# Patient Record
Sex: Female | Born: 1980 | Race: White | Hispanic: No | Marital: Single | Smoking: Former smoker
Health system: Southern US, Community
[De-identification: ages and names within clinical notes are randomized; demographics above are authoritative.]

## PROBLEM LIST (undated history)

## (undated) DIAGNOSIS — F988 Other specified behavioral and emotional disorders with onset usually occurring in childhood and adolescence: Secondary | ICD-10-CM

## (undated) HISTORY — PX: NO PAST SURGERIES: SHX2092

---
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
## 2001-08-01 ENCOUNTER — Other Ambulatory Visit: Admission: RE | Admit: 2001-08-01 | Discharge: 2001-08-01 | Payer: Self-pay | Admitting: *Deleted

## 2006-07-14 DIAGNOSIS — E669 Obesity, unspecified: Secondary | ICD-10-CM | POA: Insufficient documentation

## 2006-07-27 ENCOUNTER — Ambulatory Visit: Payer: Self-pay

## 2006-12-06 DIAGNOSIS — F988 Other specified behavioral and emotional disorders with onset usually occurring in childhood and adolescence: Secondary | ICD-10-CM

## 2006-12-06 HISTORY — DX: Other specified behavioral and emotional disorders with onset usually occurring in childhood and adolescence: F98.8

## 2006-12-30 DIAGNOSIS — S335XXA Sprain of ligaments of lumbar spine, initial encounter: Secondary | ICD-10-CM | POA: Insufficient documentation

## 2006-12-30 DIAGNOSIS — Z720 Tobacco use: Secondary | ICD-10-CM | POA: Insufficient documentation

## 2008-06-14 DIAGNOSIS — M722 Plantar fascial fibromatosis: Secondary | ICD-10-CM | POA: Insufficient documentation

## 2008-06-14 DIAGNOSIS — M549 Dorsalgia, unspecified: Secondary | ICD-10-CM | POA: Insufficient documentation

## 2008-06-14 HISTORY — DX: Plantar fascial fibromatosis: M72.2

## 2009-02-20 DIAGNOSIS — R5381 Other malaise: Secondary | ICD-10-CM | POA: Insufficient documentation

## 2015-04-12 ENCOUNTER — Ambulatory Visit: Payer: Medicaid Other

## 2015-04-12 ENCOUNTER — Ambulatory Visit
Admission: EM | Admit: 2015-04-12 | Discharge: 2015-04-12 | Disposition: A | Discharge status: Home / Self Care | Payer: Medicaid Other | Attending: Internal Medicine | Admitting: Internal Medicine

## 2015-04-12 DIAGNOSIS — Y999 Unspecified external cause status: Secondary | ICD-10-CM | POA: Insufficient documentation

## 2015-04-12 DIAGNOSIS — M25572 Pain in left ankle and joints of left foot: Secondary | ICD-10-CM | POA: Diagnosis present

## 2015-04-12 DIAGNOSIS — S92302A Fracture of unspecified metatarsal bone(s), left foot, initial encounter for closed fracture: Secondary | ICD-10-CM | POA: Diagnosis not present

## 2015-04-12 DIAGNOSIS — Y9389 Activity, other specified: Secondary | ICD-10-CM | POA: Diagnosis not present

## 2015-04-12 DIAGNOSIS — X58XXXA Exposure to other specified factors, initial encounter: Secondary | ICD-10-CM | POA: Diagnosis not present

## 2015-04-12 DIAGNOSIS — S92352A Displaced fracture of fifth metatarsal bone, left foot, initial encounter for closed fracture: Secondary | ICD-10-CM | POA: Diagnosis not present

## 2015-04-12 DIAGNOSIS — Y9289 Other specified places as the place of occurrence of the external cause: Secondary | ICD-10-CM | POA: Diagnosis not present

## 2015-04-12 DIAGNOSIS — M79672 Pain in left foot: Secondary | ICD-10-CM | POA: Diagnosis present

## 2015-04-12 HISTORY — DX: Other specified behavioral and emotional disorders with onset usually occurring in childhood and adolescence: F98.8

## 2015-04-12 MED ORDER — OXYCODONE-ACETAMINOPHEN 5-325 MG PO TABS: 2.0000 | ORAL_TABLET | ORAL | Status: DC | PRN

## 2015-04-12 MED ORDER — KETOROLAC TROMETHAMINE 60 MG/2ML IM SOLN
60.0000 mg | Freq: Once | INTRAMUSCULAR | Status: AC
  Administered 2015-04-12: 60 mg via INTRAMUSCULAR

## 2015-04-12 MED ORDER — IBUPROFEN 800 MG PO TABS: 800.0000 mg | ORAL_TABLET | Freq: Three times a day (TID) | ORAL | Status: DC

## 2015-04-12 NOTE — ED Provider Notes (Addendum)
CSN: [id number]     Arrival date & time 04/12/15  1450 History   First MD Initiated Contact with Patient 04/12/15 1601     Chief Complaint  Patient presents with  . Foot Pain  . Ankle Pain   HPI Twisted left foot/ankle and felt and heard ankle "pop". + swelling. Very painful to bear weight Was coming down some steps and missed the bottom step, inverting her left ankle she was able to walk subsequently, but does have some swelling of the L lateral ankle and is having a lot of pain at the lateral aspect of the foot, just distal to the mid foot. Also having some pain and a small bruise just inferior to the right knee. Did not hit her head, no loss of consciousness.  Past Medical History  Diagnosis Date  . ADD (attention deficit disorder)    History reviewed. No pertinent past surgical history. Family History  Problem Relation Age of Onset  . Cancer Mother   . Diabetes Father   . Coronary artery disease Father    History  Substance Use Topics  . Smoking status: Former Games developermoker  . Smokeless tobacco: Not on file  . Alcohol Use: Yes     Comment: socially   OB History    No data available     Review of Systems  All other systems reviewed and are negative.   Allergies  Penicillins  Home Medications   Prior to Admission medications   Medication Sig Start Date End Date Taking? Authorizing Provider  amphetamine-dextroamphetamine (ADDERALL XR) 15 MG 24 hr capsule Take 15 mg by mouth 2 (two) times daily.   Yes Historical Provider, MD  norethindrone-ethinyl estradiol (CYCLAFEM,ALYACEN) 0.5/0.75/1-35 MG-MCG tablet Take 1 tablet by mouth daily.   Yes Historical Provider, MD   BP 136/78 mmHg  Pulse 86  Temp(Src) 98.1 F (36.7 C) (Oral)  Resp 16  Ht 5\' 7"  (1.702 m)  Wt 190 lb (86.183 kg)  BMI 29.75 kg/m2  SpO2 99% Physical Exam  Constitutional: She is oriented to person, place, and time. No distress.  Alert, nicely groomed  HENT:  Head: Atraumatic.  Eyes:  Conjugate gaze, no  eye redness/drainage  Neck: Neck supple.  Cardiovascular: Normal rate.   Pulmonary/Chest: No respiratory distress.  Lungs clear, symmetric breath sounds  Abdominal: She exhibits no distension.  Musculoskeletal:  Left ankle with moderate swelling of the lateral aspect, no bruising, mild tenderness particularly anterior to the malleolus. More pronounced swelling, somewhat tense, lateral border of the foot from the mid foot to the fifth MTP. Quite tender there. Foot is warm, with mild restriction of ankle flexion and extension due to pain. Skin is intact. 1.5 inch bruise overlying the distal patellar tendon on the right, preserved range of motion of the right knee. No knee effusion  Neurological: She is alert and oriented to person, place, and time.  Skin: Skin is warm and dry.  No cyanosis  Nursing note and vitals reviewed.   ED Course  Procedures  Labs Review Labs Reviewed - No data to display  Imaging Review Dg Ankle Complete Left  04/12/2015   CLINICAL DATA:  Twisted ankle. Ankle and foot pain. Initial encounter.  EXAM: LEFT FOOT - COMPLETE 3+ VIEW; LEFT ANKLE COMPLETE - 3+ VIEW  COMPARISON:  None.  FINDINGS: The ankle mortise is congruent. The talar dome is intact. No ankle fracture.  The alignment of the foot is anatomic. There is an oblique minimally displaced fracture through the distal fifth  metatarsal diaphysis. No angulation. No intra-articular extension. Bony excrescence is present off the lateral aspect of the third metatarsal neck. This may be old trauma or benign osteochondroma.  IMPRESSION: Minimally displaced oblique fifth metatarsal shaft fracture.   Electronically Signed   By: Andreas NewportGeoffrey  Lamke M.D.   On: 04/12/2015 17:16   Dg Foot Complete Left  04/12/2015   CLINICAL DATA:  Twisted ankle. Ankle and foot pain. Initial encounter.  EXAM: LEFT FOOT - COMPLETE 3+ VIEW; LEFT ANKLE COMPLETE - 3+ VIEW  COMPARISON:  None.  FINDINGS: The ankle mortise is congruent. The talar dome is  intact. No ankle fracture.  The alignment of the foot is anatomic. There is an oblique minimally displaced fracture through the distal fifth metatarsal diaphysis. No angulation. No intra-articular extension. Bony excrescence is present off the lateral aspect of the third metatarsal neck. This may be old trauma or benign osteochondroma.  IMPRESSION: Minimally displaced oblique fifth metatarsal shaft fracture.   Electronically Signed   By: Andreas NewportGeoffrey  Lamke M.D.   On: 04/12/2015 17:16     MDM   1. Fracture of 5th metatarsal, left, closed, initial encounter    Injection of toradol 60mg  IM given at urgent care for pain. Patient was placed in a boot orthosis by nurse, and given crutches.  She is to be nonweightbearing until follow-up with Ortho in one week.  Rx for ibuprofen 800 mg, and #15 Percocet given for pain.  Note for work given, to cover the time period until she follows up with orthopedics.  Eustace MooreLaura W Malak Orantes, MD 04/12/15 1902  Eustace MooreLaura W Leilany Digeronimo, MD 04/18/15 [phone]

## 2015-04-12 NOTE — Discharge Instructions (Signed)
Wear boot and use crutches to keep weight off of foot until followup with orthopedist next week.  Ice and elevate to decrease swelling/pain.    Metatarsal Fracture, Undisplaced A metatarsal fracture is a break in the bone(s) of the foot. These are the bones of the foot that connect your toes to the bones of the ankle. DIAGNOSIS  The diagnoses of these fractures are usually made with X-rays. If there are problems in the forefoot and x-rays are normal a later bone scan will usually make the diagnosis.  TREATMENT AND HOME CARE INSTRUCTIONS  Treatment may or may not include a cast or walking shoe. When casts are needed the use is usually for short periods of time so as not to slow down healing with muscle wasting (atrophy).  Activities should be stopped until further advised by your caregiver.  Wear shoes with adequate shock absorbing capabilities and stiff soles.  Alternative exercise may be undertaken while waiting for healing. These may include bicycling and swimming, or as your caregiver suggests.  It is important to keep all follow-up visits or specialty referrals. The failure to keep these appointments could result in improper bone healing and chronic pain or disability.  Warning: Do not drive a car or operate a motor vehicle until your caregiver specifically tells you it is safe to do so. IF YOU DO NOT HAVE A CAST OR SPLINT:  You may walk on your injured foot as tolerated or advised.  Do not put any weight on your injured foot for as long as directed by your caregiver. Slowly increase the amount of time you walk on the foot as the pain allows or as advised.  Use crutches until you can bear weight without pain. A gradual increase in weight bearing may help.  Apply ice to the injury for 15-20 minutes each hour while awake for the first 2 days. Put the ice in a plastic bag and place a towel between the bag of ice and your skin.  Only take over-the-counter or prescription medicines for  pain, discomfort, or fever as directed by your caregiver. SEEK IMMEDIATE MEDICAL CARE IF:   Your cast gets damaged or breaks.  You have continued severe pain or more swelling than you did before the cast was put on, or the pain is not controlled with medications.  Your skin or nails below the injury turn blue or grey, or feel cold or numb.  There is a bad smell, or new stains or pus-like (purulent) drainage coming from the cast. MAKE SURE YOU:   Understand these instructions.  Will watch your condition.  Will get help right away if you are not doing well or get worse. Document Released: 08/14/2002 Document Revised: 02/14/2012 Document Reviewed: 07/05/2008 [facility] Patient Information 2015 [place], [place]. This information is not intended to replace advice given to you by your health care provider. Make sure you discuss any questions you have with your health care provider.

## 2015-04-12 NOTE — ED Notes (Signed)
Twisted left foot/ankle and felt and heard ankle "pop". + swelling. Very painful to bear weight

## 2015-09-23 IMAGING — CR DG ANKLE COMPLETE 3+V*L*
3 series · 3 of 3 positions shown · non-contrast
Comparison: None.

CLINICAL DATA: Twisted ankle. Ankle and foot pain. Initial
encounter.

EXAM:
LEFT FOOT - COMPLETE 3+ VIEW; LEFT ANKLE COMPLETE - 3+ VIEW

[foot ap]
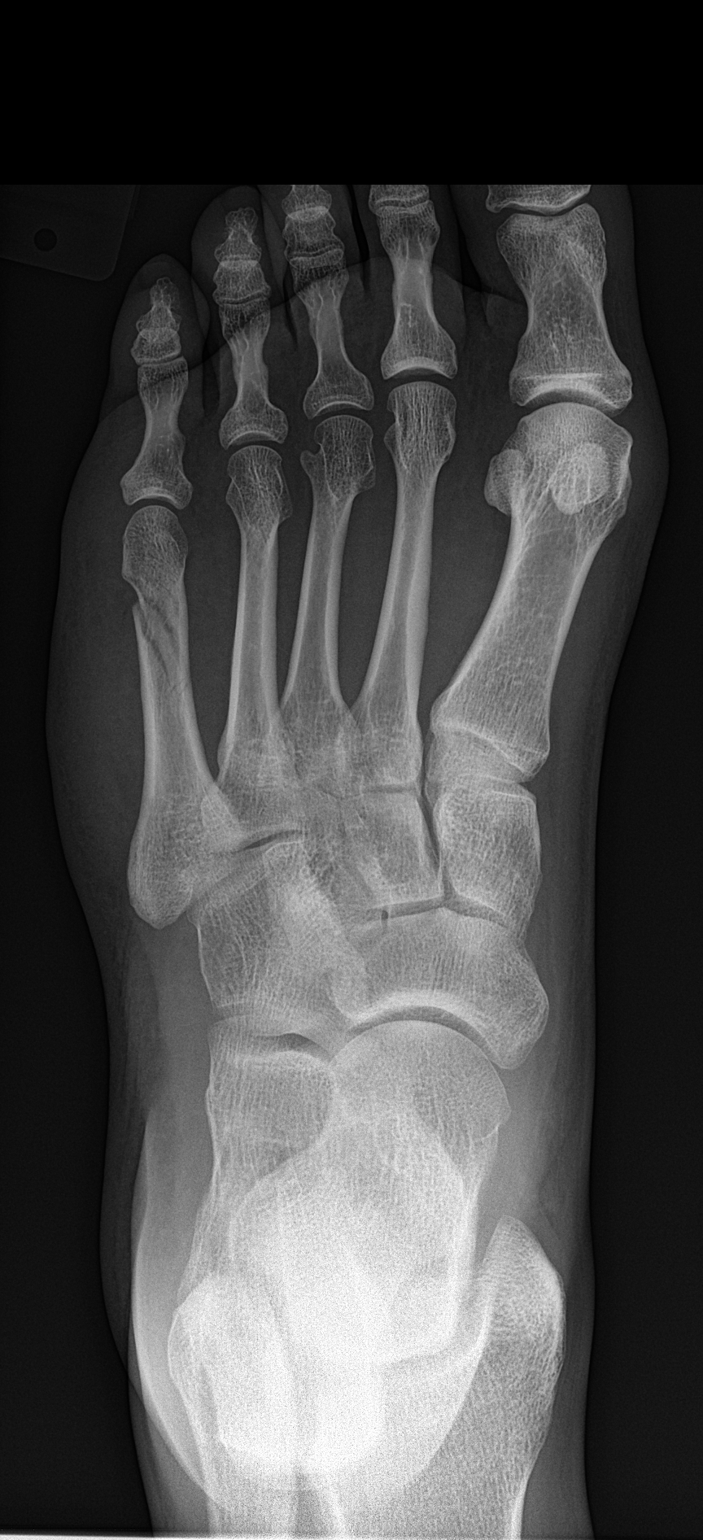

[foot obl]
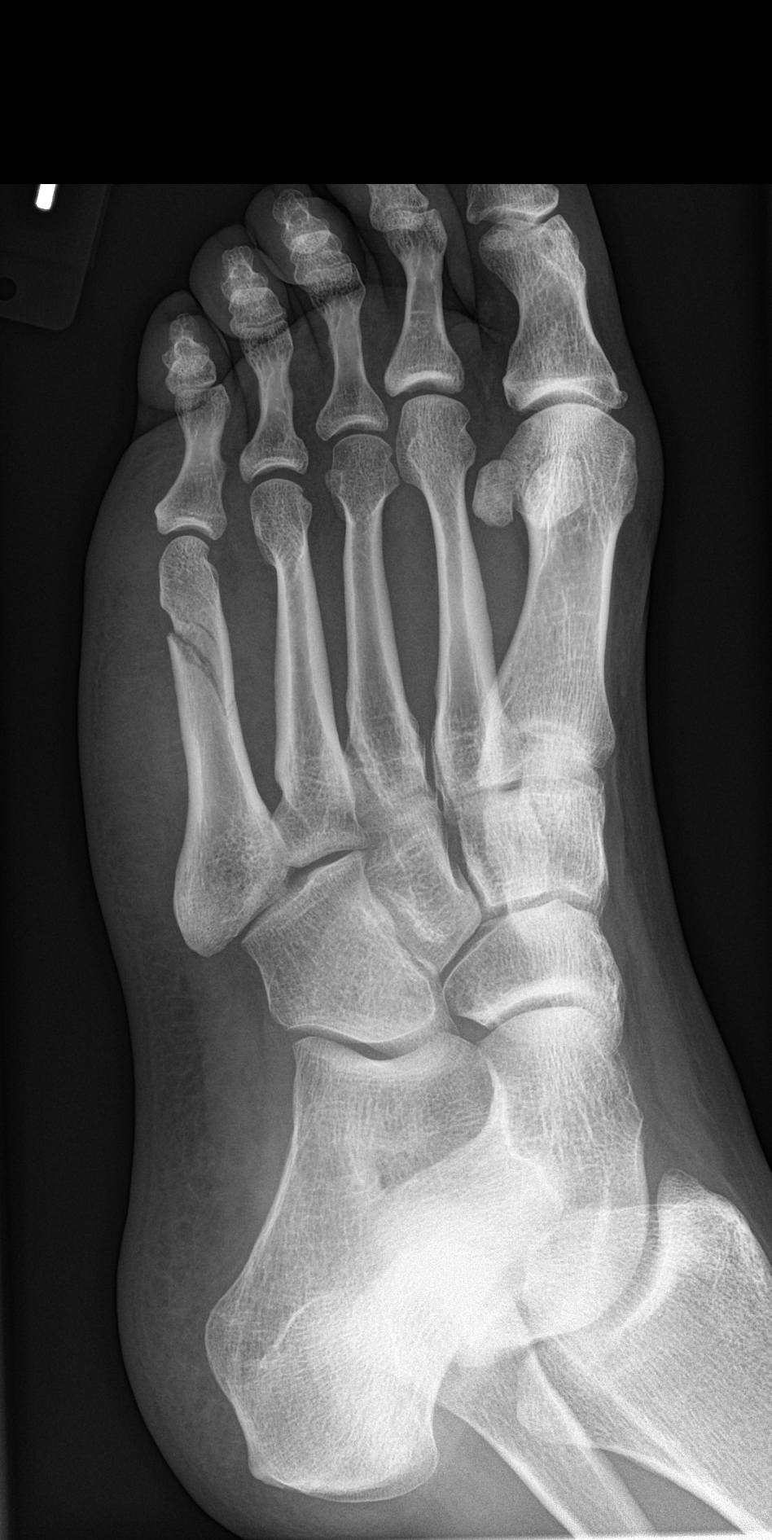

[foot lat]
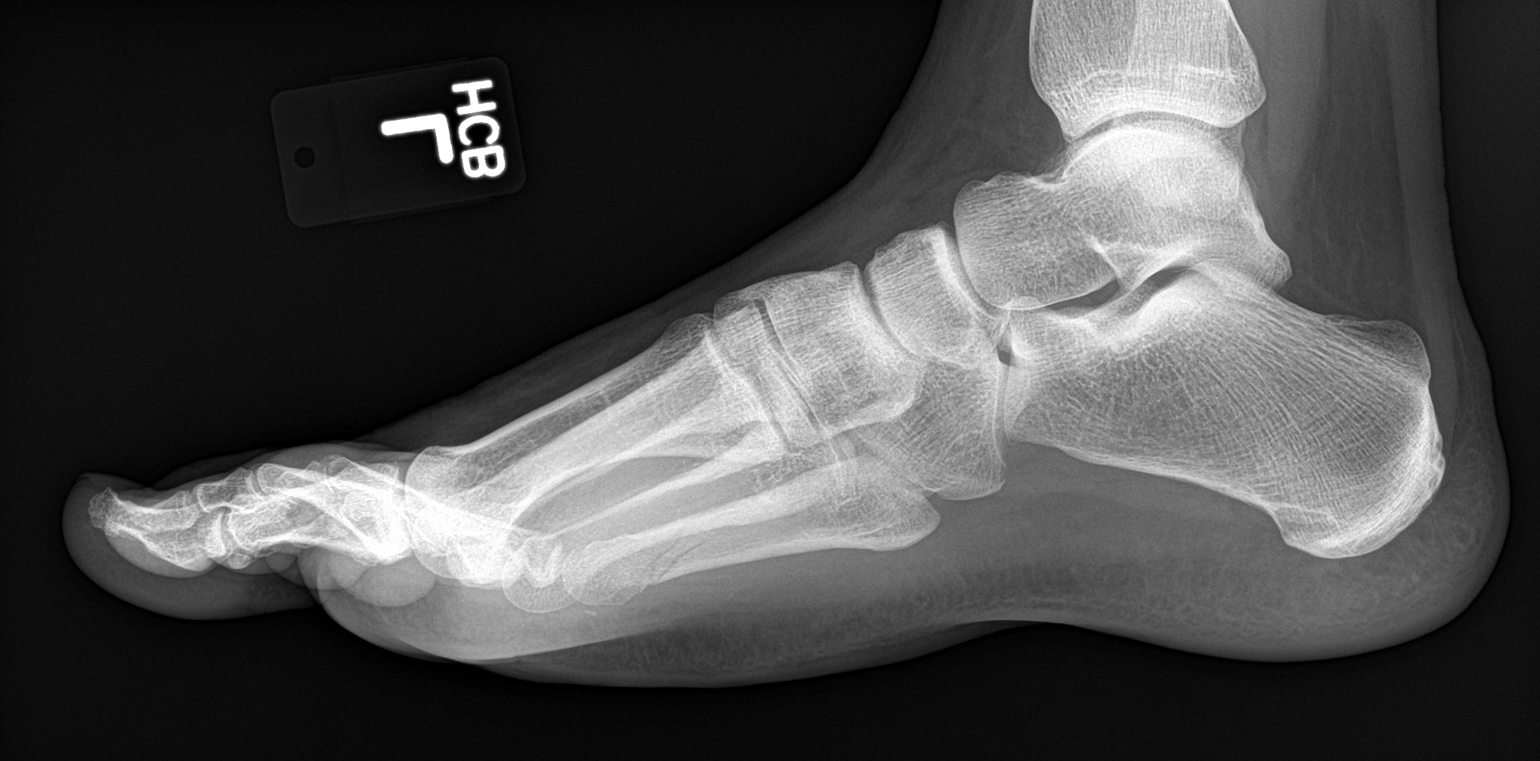

[3 of 3 positions shown; findings below may reference images not displayed]

FINDINGS: The ankle mortise is congruent. The talar dome is intact. No ankle
fracture.

The alignment of the foot is anatomic. There is an oblique minimally
displaced fracture through the distal fifth metatarsal diaphysis. No
angulation. No intra-articular extension. Bony excrescence is
present off the lateral aspect of the third metatarsal neck. This
may be old trauma or benign osteochondroma.
IMPRESSION: Minimally displaced oblique fifth metatarsal shaft fracture.

## 2016-06-07 DIAGNOSIS — L0293 Carbuncle, unspecified: Secondary | ICD-10-CM

## 2016-06-07 DIAGNOSIS — F419 Anxiety disorder, unspecified: Secondary | ICD-10-CM | POA: Insufficient documentation

## 2016-06-07 DIAGNOSIS — L0292 Furuncle, unspecified: Secondary | ICD-10-CM | POA: Insufficient documentation

## 2017-03-08 ENCOUNTER — Encounter: Payer: Self-pay | Admitting: *Deleted

## 2017-03-08 ENCOUNTER — Ambulatory Visit
Admission: EM | Admit: 2017-03-08 | Discharge: 2017-03-08 | Disposition: A | Discharge status: Home / Self Care | Payer: Medicaid Other | Attending: Family Medicine | Admitting: Family Medicine

## 2017-03-08 DIAGNOSIS — J309 Allergic rhinitis, unspecified: Secondary | ICD-10-CM

## 2017-03-08 DIAGNOSIS — J0110 Acute frontal sinusitis, unspecified: Secondary | ICD-10-CM

## 2017-03-08 MED ORDER — FLUTICASONE PROPIONATE 50 MCG/ACT NA SUSP: 2.0000 | Freq: Every day | NASAL | 0 refills | Status: DC

## 2017-03-08 MED ORDER — HYDROCOD POLST-CPM POLST ER 10-8 MG/5ML PO SUER: 5.0000 mL | Freq: Every evening | ORAL | 0 refills | Status: DC | PRN

## 2017-03-08 NOTE — Discharge Instructions (Signed)
Take medication as prescribed. Rest. Drink plenty of fluids.  ° °Follow up with your primary care physician this week as needed. Return to Urgent care for new or worsening concerns.  ° °

## 2017-03-08 NOTE — ED Triage Notes (Signed)
Patient started having symptoms of sinus pressure / drainage, cough and ear fullness 3 days ago.

## 2017-03-08 NOTE — ED Provider Notes (Signed)
[facility] ____________________________________________  Time seen: Approximately 1:09 PM  I have reviewed the triage vital signs and the nursing notes.   HISTORY  Chief Complaint Nasal Congestion and Cough  HPI Katherine Johnston is a 36 y.o. female  presents for complaints of 3-4 days runny nose, nasal congestion, sinus pressure and sinus drainage. States occasional sore throat, denies current sore throat. Denies known fevers. States has taken 2 doses of [facility] Sudafed without improvement. Patient reports symptom onset was after being outside. Reports some history of seasonal allergies in the past. Reports has continued to eat and drink well. States some cough and reports cough worse at night with associated postnasal drainage.  Denies chest pain, shortness of breath, abdominal pain, extremity pain, extremity swelling or rash. Denies recent sickness. Denies recent antibiotic use.   No LMP recorded. Patient is not currently having periods (Reason: Oral contraceptives). Denies pregnancy.    Past Medical History:  Diagnosis Date  . ADD (attention deficit disorder)     Patient Active Problem List   Diagnosis Date Noted  . Anxiety disorder 06/07/2016  . Carbuncle and furuncle 06/07/2016  . Malaise and fatigue 02/20/2009  . Back ache 06/14/2008  . Dupuytren's contracture of foot 06/14/2008  . Lumbar back sprain 12/30/2006  . Tobacco use 12/30/2006  . Adiposity 07/14/2006  . Allergic rhinitis 06/14/2006    History reviewed. No pertinent surgical history.   No current facility-administered medications for this encounter.   Current Outpatient Prescriptions:  .  norethindrone-ethinyl estradiol (CYCLAFEM,ALYACEN) 0.5/0.75/1-35 MG-MCG tablet, Take 1 tablet by mouth daily., Disp: , Rfl:  .  amphetamine-dextroamphetamine (ADDERALL XR) 15 MG 24 hr capsule, Take 15 mg by mouth 2 (two) times daily., Disp: , Rfl:  .  buPROPion (WELLBUTRIN) 75 MG tablet, Take 1 tablet by  mouth 2 (two) times daily., Disp: , Rfl:  .  chlorpheniramine-HYDROcodone (TUSSIONEX PENNKINETIC ER) 10-8 MG/5ML SUER, Take 5 mLs by mouth at bedtime as needed for cough. do not drive or operate machinery while taking as can cause drowsiness., Disp: 75 mL, Rfl: 0 .  clonazePAM (KLONOPIN) 0.5 MG tablet, Take by mouth., Disp: , Rfl:  .  fluticasone (FLONASE) 50 MCG/ACT nasal spray, Place 2 sprays into both nostrils daily., Disp: 1 g, Rfl: 0 .  ibuprofen (ADVIL,MOTRIN) 800 MG tablet, Take 1 tablet (800 mg total) by mouth 3 (three) times daily., Disp: 21 tablet, Rfl: 0 .  oxyCODONE-acetaminophen (PERCOCET/ROXICET) 5-325 MG per tablet, Take 2 tablets by mouth every 4 (four) hours as needed for severe pain., Disp: 15 tablet, Rfl: 0  Allergies Penicillins  Family History  Problem Relation Age of Onset  . Cancer Mother   . Diabetes Father   . Coronary artery disease Father   . Diabetes Brother   . ADD / ADHD Brother   . Sleep apnea Brother     Social History Social History  Substance Use Topics  . Smoking status: Former Games developer  . Smokeless tobacco: Never Used  . Alcohol use Yes     Comment: socially    Review of Systems Constitutional: No fever/chills Eyes: No visual changes. ENT:As above. Cardiovascular: Denies chest pain. Respiratory: Denies shortness of breath. Gastrointestinal: No abdominal pain.  No nausea, no vomiting.  No diarrhea.  No constipation. Genitourinary: Negative for dysuria. Musculoskeletal: Negative for back pain. Skin: Negative for rash. Neurological: Negative for headaches, focal weakness or numbness.  10-point ROS otherwise negative.  ____________________________________________   PHYSICAL EXAM:  VITAL SIGNS: ED Triage Vitals  Enc  Vitals Group     BP 03/08/17 1209 (!) 132/58     Pulse Rate 03/08/17 1209 89     Resp 03/08/17 1209 16     Temp 03/08/17 1209 98.7 F (37.1 C)     Temp Source 03/08/17 1209 Oral     SpO2 03/08/17 1209 99 %     Weight  03/08/17 1209 198 lb (89.8 kg)     Height 03/08/17 1209  (1.702 m)     Head Circumference --      Peak Flow --      Pain Score 03/08/17 1210 6     Pain Loc --      Pain Edu? --      Excl. in GC? --     Constitutional: Alert and oriented. Well appearing and in no acute distress. Eyes: Conjunctivae are normal. PERRL. EOMI. Head: Atraumatic.Mild tenderness to palpation bilateral frontal and maxillary sinuses. No swelling. No erythema.   Ears: no erythema, normal TMs bilaterally.   Nose: nasal congestion with bilateral nasal turbinate erythema and edema.   Mouth/Throat: Mucous membranes are moist.  Oropharynx non-erythematous.No tonsillar swelling or exudate.  Neck: No stridor.  No cervical spine tenderness to palpation. Hematological/Lymphatic/Immunilogical: No cervical lymphadenopathy. Cardiovascular: Normal rate, regular rhythm. Grossly normal heart sounds. Good peripheral circulation. Respiratory: Normal respiratory effort.  No retractions.  No wheezes, rales or rhonchi. Good air movement.  Gastrointestinal: Soft and nontender. Musculoskeletal: No cervical, thoracic or lumbar tenderness to palpation.  Neurologic:  Normal speech and language. No gait instability. Skin:  Skin is warm, dry and intact. No rash noted. Psychiatric: Mood and affect are normal. Speech and behavior are normal.  ___________________________________________   LABS (all labs ordered are listed, but only abnormal results are displayed)  Labs Reviewed - No data to display ____________________________________________  RADIOLOGY  No results found. ____________________________________________   PROCEDURES Procedures   INITIAL IMPRESSION / ASSESSMENT AND PLAN / ED COURSE  Pertinent labs & imaging results that were available during my care of the patient were reviewed by me and considered in my medical decision making (see chart for details).  Well-appearing patient. No acute distress. Discussed the  patient suspect viral or allergic sinusitis. Discussed with patient no clear indication for antibiotic use at this time. Will treat patient with over-the-counter oral Sudafed, Claritin, when necessary Tussionex and Flonase. Encourage rest, fluids and supportive care. Discussed strep swab, patient declines. Discussed indication, risks and benefits of medications with patient.  Discussed follow up with Primary care physician this week. Discussed follow up and return parameters including no resolution or any worsening concerns. Patient verbalized understanding and agreed to plan.   ____________________________________________   FINAL CLINICAL IMPRESSION(S) / ED DIAGNOSES  Final diagnoses:  Acute allergic rhinitis, unspecified seasonality, unspecified trigger  Acute frontal sinusitis, recurrence not specified     Discharge Medication List as of 03/08/2017  1:17 PM    START taking these medications   Details  chlorpheniramine-HYDROcodone (TUSSIONEX PENNKINETIC ER) 10-8 MG/5ML SUER Take 5 mLs by mouth at bedtime as needed for cough. do not drive or operate machinery while taking as can cause drowsiness., Starting Tue 03/08/2017, Print    fluticasone (FLONASE) 50 MCG/ACT nasal spray Place 2 sprays into both nostrils daily., Starting Tue 03/08/2017, Until Tue 03/22/2017, Normal        Note: This dictation was prepared with Dragon dictation along with smaller phrase technology. Any transcriptional errors that result from this process are unintentional.  Renford Dills, NP 03/08/17 1344

## 2018-03-22 ENCOUNTER — Other Ambulatory Visit: Payer: Self-pay

## 2018-03-22 ENCOUNTER — Ambulatory Visit
Admission: EM | Admit: 2018-03-22 | Discharge: 2018-03-22 | Disposition: A | Discharge status: Home / Self Care | Payer: Medicaid Other | Attending: Family Medicine | Admitting: Family Medicine

## 2018-03-22 DIAGNOSIS — R05 Cough: Secondary | ICD-10-CM | POA: Diagnosis not present

## 2018-03-22 DIAGNOSIS — M791 Myalgia, unspecified site: Secondary | ICD-10-CM

## 2018-03-22 DIAGNOSIS — R509 Fever, unspecified: Secondary | ICD-10-CM

## 2018-03-22 DIAGNOSIS — J111 Influenza due to unidentified influenza virus with other respiratory manifestations: Secondary | ICD-10-CM

## 2018-03-22 DIAGNOSIS — R69 Illness, unspecified: Principal | ICD-10-CM

## 2018-03-22 MED ORDER — IBUPROFEN 800 MG PO TABS: 800.0000 mg | ORAL_TABLET | Freq: Three times a day (TID) | ORAL | 0 refills | Status: DC

## 2018-03-22 MED ORDER — OSELTAMIVIR PHOSPHATE 75 MG PO CAPS: 75.0000 mg | ORAL_CAPSULE | Freq: Two times a day (BID) | ORAL | 0 refills | Status: DC

## 2018-03-22 MED ORDER — BENZONATATE 200 MG PO CAPS: ORAL_CAPSULE | ORAL | 0 refills | Status: DC

## 2018-03-22 MED ORDER — GUAIFENESIN-CODEINE 100-10 MG/5ML PO SYRP: 5.0000 mL | ORAL_SOLUTION | Freq: Three times a day (TID) | ORAL | 0 refills | Status: DC | PRN

## 2018-03-22 NOTE — ED Provider Notes (Signed)
[facility]    CSN: [id number] Arrival date & time: 03/22/18  1325     History   Chief Complaint Chief Complaint  Patient presents with  . Generalized Body Aches    HPI Katherine Johnston is a 37 y.o. female.   HPI  37 year old female presents with the sudden onset of body aches and fatigue that started last night.  Was coughing all night long.  The cough is only mildly productive of sputum.  Did not receive her flu shot this year.  Temperature was 99.2 but she had just taken Tylenol prior to coming into the office.  She states that her daughter was diagnosed with the flu this morning at the pediatrician's office and started on Tamiflu.         Past Medical History:  Diagnosis Date  . ADD (attention deficit disorder)     Patient Active Problem List   Diagnosis Date Noted  . Anxiety disorder 06/07/2016  . Carbuncle and furuncle 06/07/2016  . Malaise and fatigue 02/20/2009  . Back ache 06/14/2008  . Dupuytren's contracture of foot 06/14/2008  . Lumbar back sprain 12/30/2006  . Tobacco use 12/30/2006  . Adiposity 07/14/2006  . Allergic rhinitis 06/14/2006    Past Surgical History:  Procedure Laterality Date  . NO PAST SURGERIES      OB History    Gravida  1   Para  1   Term      Preterm      AB      Living        SAB      TAB      Ectopic      Multiple      Live Births               Home Medications    Prior to Admission medications   Medication Sig Start Date End Date Taking? Authorizing Provider  norethindrone-ethinyl estradiol (CYCLAFEM,ALYACEN) 0.5/0.75/1-35 MG-MCG tablet Take 1 tablet by mouth daily.   Yes [provider]  benzonatate (TESSALON) 200 MG capsule Take one cap TID PRN cough 03/22/18   Lutricia Feil, PA-C  fluticasone [facility]) 50 MCG/ACT nasal spray Place 2 sprays into both nostrils daily. 03/08/17 03/22/17  Renford Dills, NP  guaiFENesin-codeine (CHERATUSSIN AC) 100-10 MG/5ML syrup Take 5 mLs  by mouth 3 (three) times daily as needed for cough. 03/22/18   Lutricia Feil, PA-C  ibuprofen (ADVIL,MOTRIN) 800 MG tablet Take 1 tablet (800 mg total) by mouth 3 (three) times daily. 03/22/18   Lutricia Feil, PA-C  oseltamivir (TAMIFLU) 75 MG capsule Take 1 capsule (75 mg total) by mouth every 12 (twelve) hours. 03/22/18   Lutricia Feil, PA-C    Family History Family History  Problem Relation Age of Onset  . Cancer Mother   . Diabetes Father   . Coronary artery disease Father   . Diabetes Brother   . ADD / ADHD Brother   . Sleep apnea Brother     Social History Social History   Tobacco Use  . Smoking status: Former Games developer  . Smokeless tobacco: Never Used  Substance Use Topics  . Alcohol use: Yes    Comment: socially  . Drug use: No     Allergies   Penicillins   Review of Systems Review of Systems  Constitutional: Positive for activity change, chills, fatigue and fever.  HENT: Positive for congestion and sore throat.   Respiratory: Positive for cough.   All  other systems reviewed and are negative.    Physical Exam Triage Vital Signs ED Triage Vitals  Enc Vitals Group     BP 03/22/18 1340 124/67     Pulse Rate 03/22/18 1340 86     Resp 03/22/18 1340 16     Temp 03/22/18 1340 99.2 F (37.3 C)     Temp Source 03/22/18 1340 Oral     SpO2 03/22/18 1340 99 %     Weight 03/22/18 1338 200 lb (90.7 kg)     Height 03/22/18 1338 5\' 7"  (1.702 m)     Head Circumference --      Peak Flow --      Pain Score 03/22/18 1338 6     Pain Loc --      Pain Edu? --      Excl. in GC? --    No data found.  Updated Vital Signs BP 124/67 (BP Location: Left Arm)   Pulse 86   Temp 99.2 F (37.3 C) (Oral) Comment: tylenol one hour ago  Resp 16   Ht 5\' 7"  (1.702 m)   Wt 200 lb (90.7 kg)   SpO2 99%   BMI 31.32 kg/m   Visual Acuity Right Eye Distance:   Left Eye Distance:   Bilateral Distance:    Right Eye Near:   Left Eye Near:    Bilateral Near:      Physical Exam  Constitutional: She is oriented to person, place, and time. She appears well-developed and well-nourished. No distress.  HENT:  Head: Normocephalic.  Right Ear: External ear normal.  Left Ear: External ear normal.  Nose: Nose normal.  Mouth/Throat: Oropharynx is clear and moist. No oropharyngeal exudate.  Eyes: Pupils are equal, round, and reactive to light. Right eye exhibits no discharge. Left eye exhibits no discharge.  Neck: Normal range of motion.  Pulmonary/Chest: Effort normal and breath sounds normal.  Musculoskeletal: Normal range of motion.  Lymphadenopathy:    She has no cervical adenopathy.  Neurological: She is alert and oriented to person, place, and time.  Skin: Skin is warm and dry. She is not diaphoretic.  Psychiatric: She has a normal mood and affect. Her behavior is normal. Judgment and thought content normal.  Nursing note and vitals reviewed.    UC Treatments / Results  Labs (all labs ordered are listed, but only abnormal results are displayed) Labs Reviewed - No data to display  EKG None Radiology No results found.  Procedures Procedures (including critical care time)  Medications Ordered in UC Medications - No data to display   Initial Impression / Assessment and Plan / UC Course  I have reviewed the triage vital signs and the nursing notes.  Pertinent labs & imaging results that were available during my care of the patient were reviewed by me and considered in my medical decision making (see chart for details).    Plan: 1. Test/x-ray results and diagnosis reviewed with patient 2. rx as per orders; risks, benefits, potential side effects reviewed with patient 3. Recommend supportive treatment with flu and cough suppressants.  Will provide her with ibuprofen fever and body aches.  She worsens or is not improving should follow-up with her primary care physician 4. F/u prn if symptoms worsen or don't improve   Final Clinical  Impressions(s) / UC Diagnoses   Final diagnoses:  Influenza-like illness    ED Discharge Orders        Ordered    oseltamivir (TAMIFLU) 75 MG capsule  Every 12 hours     03/22/18 1403    ibuprofen (ADVIL,MOTRIN) 800 MG tablet  3 times daily     03/22/18 1403    benzonatate (TESSALON) 200 MG capsule     03/22/18 1403    guaiFENesin-codeine (CHERATUSSIN AC) 100-10 MG/5ML syrup  3 times daily PRN     03/22/18 1403       Controlled Substance Prescriptions [place] Controlled Substance Registry consulted? Not Applicable   Lutricia Feil, PA-C 03/22/18 1929

## 2018-03-22 NOTE — ED Triage Notes (Signed)
Patient complains of bodyaches and fatigue that started last night. Patient states that her daughter was diagnosed with the flu this morning.

## 2019-02-13 DIAGNOSIS — Z30011 Encounter for initial prescription of contraceptive pills: Secondary | ICD-10-CM | POA: Diagnosis not present

## 2019-02-13 DIAGNOSIS — Z0389 Encounter for observation for other suspected diseases and conditions ruled out: Secondary | ICD-10-CM | POA: Diagnosis not present

## 2019-02-13 DIAGNOSIS — Z309 Encounter for contraceptive management, unspecified: Secondary | ICD-10-CM | POA: Diagnosis not present

## 2019-02-13 DIAGNOSIS — Z113 Encounter for screening for infections with a predominantly sexual mode of transmission: Secondary | ICD-10-CM | POA: Diagnosis not present

## 2019-02-13 DIAGNOSIS — Z3009 Encounter for other general counseling and advice on contraception: Secondary | ICD-10-CM | POA: Diagnosis not present

## 2019-02-13 DIAGNOSIS — Z1388 Encounter for screening for disorder due to exposure to contaminants: Secondary | ICD-10-CM | POA: Diagnosis not present

## 2019-02-13 DIAGNOSIS — Z30019 Encounter for initial prescription of contraceptives, unspecified: Secondary | ICD-10-CM | POA: Diagnosis not present

## 2019-02-13 LAB — HM HIV SCREENING LAB: HM HIV Screening: NEGATIVE

## 2019-02-13 LAB — HM PAP SMEAR

## 2019-02-28 ENCOUNTER — Telehealth: Payer: Self-pay | Admitting: Obstetrics & Gynecology

## 2019-02-28 NOTE — Telephone Encounter (Signed)
[facility] referring for [facility]. Paper records Called and left voicemail for patient to call back to be schedule

## 2019-03-06 NOTE — Telephone Encounter (Signed)
Called and left voice mail for patient to call back to be schedule °

## 2019-03-12 ENCOUNTER — Ambulatory Visit: Payer: Self-pay | Admitting: Obstetrics & Gynecology

## 2019-03-21 ENCOUNTER — Ambulatory Visit: Payer: Self-pay | Admitting: Obstetrics & Gynecology

## 2019-04-04 ENCOUNTER — Ambulatory Visit: Payer: Self-pay | Admitting: Obstetrics & Gynecology

## 2019-12-25 DIAGNOSIS — Z1389 Encounter for screening for other disorder: Secondary | ICD-10-CM | POA: Diagnosis not present

## 2019-12-25 DIAGNOSIS — F900 Attention-deficit hyperactivity disorder, predominantly inattentive type: Secondary | ICD-10-CM | POA: Diagnosis not present

## 2020-01-22 DIAGNOSIS — F900 Attention-deficit hyperactivity disorder, predominantly inattentive type: Secondary | ICD-10-CM | POA: Diagnosis not present

## 2020-02-19 DIAGNOSIS — F900 Attention-deficit hyperactivity disorder, predominantly inattentive type: Secondary | ICD-10-CM | POA: Diagnosis not present

## 2020-02-19 DIAGNOSIS — F410 Panic disorder [episodic paroxysmal anxiety] without agoraphobia: Secondary | ICD-10-CM | POA: Diagnosis not present

## 2020-02-19 DIAGNOSIS — Z79899 Other long term (current) drug therapy: Secondary | ICD-10-CM | POA: Diagnosis not present

## 2020-02-19 DIAGNOSIS — F909 Attention-deficit hyperactivity disorder, unspecified type: Secondary | ICD-10-CM | POA: Diagnosis not present

## 2020-02-19 DIAGNOSIS — F902 Attention-deficit hyperactivity disorder, combined type: Secondary | ICD-10-CM | POA: Diagnosis not present

## 2020-02-19 DIAGNOSIS — F4322 Adjustment disorder with anxiety: Secondary | ICD-10-CM | POA: Diagnosis not present

## 2020-04-23 ENCOUNTER — Encounter: Payer: Self-pay | Admitting: Advanced Practice Midwife

## 2020-04-23 ENCOUNTER — Ambulatory Visit (LOCAL_COMMUNITY_HEALTH_CENTER): Payer: Medicaid Other | Admitting: Advanced Practice Midwife

## 2020-04-23 ENCOUNTER — Other Ambulatory Visit: Payer: Self-pay

## 2020-04-23 VITALS — BP 115/76 | Ht 66.5 in | Wt 218.2 lb

## 2020-04-23 DIAGNOSIS — F909 Attention-deficit hyperactivity disorder, unspecified type: Secondary | ICD-10-CM

## 2020-04-23 DIAGNOSIS — E669 Obesity, unspecified: Secondary | ICD-10-CM | POA: Diagnosis not present

## 2020-04-23 DIAGNOSIS — Z3041 Encounter for surveillance of contraceptive pills: Secondary | ICD-10-CM

## 2020-04-23 DIAGNOSIS — F419 Anxiety disorder, unspecified: Secondary | ICD-10-CM | POA: Diagnosis not present

## 2020-04-23 DIAGNOSIS — Z87898 Personal history of other specified conditions: Secondary | ICD-10-CM | POA: Diagnosis not present

## 2020-04-23 DIAGNOSIS — Z1388 Encounter for screening for disorder due to exposure to contaminants: Secondary | ICD-10-CM | POA: Diagnosis not present

## 2020-04-23 DIAGNOSIS — F1991 Other psychoactive substance use, unspecified, in remission: Secondary | ICD-10-CM

## 2020-04-23 DIAGNOSIS — Z30011 Encounter for initial prescription of contraceptive pills: Secondary | ICD-10-CM | POA: Diagnosis not present

## 2020-04-23 DIAGNOSIS — R87619 Unspecified abnormal cytological findings in specimens from cervix uteri: Secondary | ICD-10-CM | POA: Insufficient documentation

## 2020-04-23 DIAGNOSIS — Z0389 Encounter for observation for other suspected diseases and conditions ruled out: Secondary | ICD-10-CM | POA: Diagnosis not present

## 2020-04-23 DIAGNOSIS — Z3009 Encounter for other general counseling and advice on contraception: Secondary | ICD-10-CM

## 2020-04-23 DIAGNOSIS — Z720 Tobacco use: Secondary | ICD-10-CM

## 2020-04-23 DIAGNOSIS — R87611 Atypical squamous cells cannot exclude high grade squamous intraepithelial lesion on cytologic smear of cervix (ASC-H): Secondary | ICD-10-CM | POA: Diagnosis not present

## 2020-04-23 LAB — WET PREP FOR TRICH, YEAST, CLUE
Trichomonas Exam: NEGATIVE
Yeast Exam: NEGATIVE

## 2020-04-23 NOTE — Progress Notes (Signed)
Family Planning Visit- Initial Visit  Subjective:  Katherine Johnston is a 39 y.o. SWF G1P1001 exsmoker   being seen today for an initial well woman visit and to discuss family planning options.  Katherine Johnston is currently using ran out of ocp's 04/16/20 for pregnancy prevention. Katherine Johnston reports Katherine Johnston does not want a pregnancy in the next year.  Katherine Johnston has the following medical conditions has Allergic rhinitis; Anxiety disorder dx'd age 25; Carbuncle and furuncle; Back ache; Malaise and fatigue; Adiposity; Dupuytren's contracture of foot; Lumbar back sprain; Tobacco use; Obesity BMI=34.6; ADHD--sees psych for meds; Abnormal Pap smear of cervix; and History of drug use (MJ, cocaine, meth, acid) on their problem list.  Chief Complaint  Katherine Johnston presents with  . Contraception  . Gynecologic Exam    Katherine Johnston reports last PE and pap 02/13/2019 neg HPV +.  Pap 12/13/2017 neg HPV+.  Working 30 hrs/wk.  Last cig 15 years ago.  LMP 04/18/20.  Last sex 3 years ago.  Last MJ 11/2019.  Last cocaine 15 years ago.  Last meth 16 years ago.  Last acid 23 years ago.  Anxiety dx'd age 59.  Living with 24 yo daughter.  Katherine Johnston denies cigs, drug use.   Body mass index is 34.69 kg/m. - Katherine Johnston is eligible for diabetes screening based on BMI and age >27?  not applicable HA1C ordered? not applicable  Katherine Johnston reports 0 of partners in last year. Desires STI screening?  Yes  Has Katherine Johnston been screened once for HCV in the past?  No  No results found for: HCVAB  Does the Katherine Johnston have current of drug use, have a partner with drug use, and/or has been incarcerated since last result? No  If yes-- Screen for HCV through [facility]   Does the Katherine Johnston meet criteria for HBV testing? Yes  Criteria:  -Household, sexual or needle sharing contact with HBV -History of drug use -HIV positive -Those with known Hep C   Health Maintenance Due  Topic Date Due  . COVID-19 Vaccine (1) Never done  . TETANUS/TDAP  Never done    Review of  Systems  All other systems reviewed and are negative.   The following portions of the Katherine Johnston's history were reviewed and updated as appropriate: allergies, current medications, past family history, past medical history, past social history, past surgical history and problem list. Problem list updated.   See flowsheet for other program required questions.  Objective:   Vitals:   04/23/20 1001  BP: 115/76  Weight: 218 lb 3.2 oz (99 kg)  Height: 5' 6.5" (1.689 m)    Physical Exam Constitutional:      Appearance: Normal appearance. Katherine Johnston is obese.  HENT:     Head: Normocephalic and atraumatic.     Mouth/Throat:     Mouth: Mucous membranes are moist.  Eyes:     Conjunctiva/sclera: Conjunctivae normal.  Cardiovascular:     Rate and Rhythm: Normal rate and regular rhythm.  Pulmonary:     Effort: Pulmonary effort is normal.     Breath sounds: Normal breath sounds.  Abdominal:     Palpations: Abdomen is soft.     Comments: Soft without tenderness, fair tone  Genitourinary:    General: Normal vulva.     Exam position: Lithotomy position.     Vagina: Vaginal discharge (white creamy leukorrhea, ph<4.5) present.     Cervix: Eversion (moderate eversion on anterior lip, pap done) present.     Uterus: Normal.  Adnexa: Right adnexa normal and left adnexa normal.     Rectum: Normal.  Musculoskeletal:        General: Normal range of motion.     Cervical back: Normal range of motion and neck supple.  Skin:    General: Skin is warm and dry.  Neurological:     Mental Status: Katherine Johnston is alert.  Psychiatric:        Mood and Affect: Mood is anxious.       Assessment and Plan:  Katherine Johnston is a 39 y.o. female presenting to the [facility] Department for an initial well woman exam/family planning visit  Contraception counseling: Reviewed all forms of birth control options in the tiered based approach. available including abstinence; over the counter/barrier methods;  hormonal contraceptive medication including pill, patch, ring, injection,contraceptive implant, ECP; hormonal and nonhormonal IUDs; permanent sterilization options including vasectomy and the various tubal sterilization modalities. Risks, benefits, and typical effectiveness rates were reviewed.  Questions were answered.  Written information was also given to the Katherine Johnston to review.  Katherine Johnston desires ocp's, this was prescribed for Katherine Johnston. Katherine Johnston will follow up in 1 year for surveillance.  Katherine Johnston was told to call with any further questions, or with any concerns about this method of contraception.  Emphasized use of condoms 100% of the time for STI prevention.  Katherine Johnston was offered ECP. ECP was not accepted by the Katherine Johnston. ECP counseling was not given - see RN documentation  1. Obesity, unspecified classification, unspecified obesity type, unspecified whether serious comorbidity present  2. Attention deficit hyperactivity disorder (ADHD), unspecified ADHD type Sees psychiatrist for meds  3. Tobacco use Denies use past 15 years  4. Family planning Treat wet mount per standing orders Immunization nurse consult - WET PREP FOR [place], YEAST, CLUE - HCV [facility] - HBV Antigen/[facility]ib[facility] - Chlamydia/Gonorrhea [facility] - IGP, Aptima HPV  5. Encounter for surveillance of contraceptive pills Rx given for continuous cycle norethindrone estrace x 1 year  6. Abnormal cervical Papanicolaou smear, unspecified abnormal pap finding Pap done  7. History of drug use (MJ, cocaine, meth, acid)      No follow-ups on file.  No future appointments.  Katherine Johnston, CNM

## 2020-04-23 NOTE — Progress Notes (Signed)
Here today for RP and birth control pill refill. Last PE and Pap Smear was 02/13/2019. Ran out of pills last week. Accepts STD screening, declines bloodwork. Tawny Hopping, RN

## 2020-04-26 LAB — IGP, APTIMA HPV
HPV Aptima: NEGATIVE
PAP Smear Comment: 0

## 2020-04-30 LAB — HM HEPATITIS C SCREENING LAB: HM Hepatitis Screen: NEGATIVE

## 2020-04-30 LAB — HEPATITIS B SURFACE ANTIGEN

## 2020-05-08 ENCOUNTER — Telehealth: Payer: Self-pay | Admitting: Obstetrics & Gynecology

## 2020-05-08 NOTE — Telephone Encounter (Signed)
SCHEDULED

## 2020-05-08 NOTE — Telephone Encounter (Signed)
[facility] referring for ATYPICAL SQUAMOUS CELLS, CANNOT EXCLUDE HIGH-GRADE SQUAMOUS INTRAEPITHELIAL LESION (ASC-H). Called and left voicemail for patient to call back to be scheduled.

## 2020-05-26 ENCOUNTER — Ambulatory Visit (INDEPENDENT_AMBULATORY_CARE_PROVIDER_SITE_OTHER): Payer: Medicaid Other | Admitting: Obstetrics and Gynecology

## 2020-05-26 ENCOUNTER — Other Ambulatory Visit: Payer: Self-pay

## 2020-05-26 ENCOUNTER — Encounter: Payer: Self-pay | Admitting: Obstetrics and Gynecology

## 2020-05-26 ENCOUNTER — Other Ambulatory Visit (HOSPITAL_COMMUNITY)
Admission: RE | Admit: 2020-05-26 | Discharge: 2020-05-26 | Disposition: A | Discharge status: Home / Self Care | Payer: Medicaid Other | Source: Ambulatory Visit | Attending: Obstetrics and Gynecology | Admitting: Obstetrics and Gynecology

## 2020-05-26 VITALS — BP 122/74 | Ht 67.0 in | Wt 218.0 lb

## 2020-05-26 DIAGNOSIS — R87611 Atypical squamous cells cannot exclude high grade squamous intraepithelial lesion on cytologic smear of cervix (ASC-H): Secondary | ICD-10-CM | POA: Insufficient documentation

## 2020-05-26 DIAGNOSIS — N870 Mild cervical dysplasia: Secondary | ICD-10-CM | POA: Diagnosis not present

## 2020-05-26 NOTE — Progress Notes (Signed)
Referring Provider:  [facility], Donnal Moat, CNM, for abnormal pap smear.  HPI:  ARLYCE CIRCLE is a 39 y.o.  G1P1001  who presents today for evaluation and management of abnormal cervical cytology.    Dysplasia History:   04/23/2020: ASC-H, HPV negative 02/13/2019: NILM, HPV+  OB History  Gravida Para Term Preterm AB Living  1 1 1  0 0 1  SAB TAB Ectopic Multiple Live Births  0 0 0 0 1    # Outcome Date GA Lbr Len/2nd Weight Sex Delivery Anes PTL Lv  1 Term             Past Medical History:  Diagnosis Date   ADD (attention deficit disorder)     Past Surgical History:  Procedure Laterality Date   NO PAST SURGERIES      SOCIAL HISTORY:  Social History   Substance and Sexual Activity  Alcohol Use Yes   Comment: socially    Social History   Substance and Sexual Activity  Drug Use Not Currently     Family History  Problem Relation Age of Onset   Cancer Mother    Heart disease Mother    Kidney disease Mother    Diabetes Father    Coronary artery disease Father    Heart disease Father    Hyperlipidemia Father    Hypertension Father    Diabetes Brother    ADD / ADHD Brother    Sleep apnea Brother    Heart disease Paternal Grandfather     ALLERGIES:  Penicillins  Current Outpatient Medications on File Prior to Visit  Medication Sig Dispense Refill   benzonatate (TESSALON) 200 MG capsule Take one cap TID PRN cough (Patient not taking: Reported on 04/23/2020) 30 capsule 0   fluticasone (FLONASE) 50 MCG/ACT nasal spray Place 2 sprays into both nostrils daily. 1 g 0   guaiFENesin-codeine (CHERATUSSIN AC) 100-10 MG/5ML syrup Take 5 mLs by mouth 3 (three) times daily as needed for cough. (Patient not taking: Reported on 04/23/2020) 120 mL 0   ibuprofen (ADVIL,MOTRIN) 800 MG tablet Take 1 tablet (800 mg total) by mouth 3 (three) times daily. 60 tablet 0   norethindrone-ethinyl estradiol (CYCLAFEM,ALYACEN) 0.5/0.75/1-35 MG-MCG tablet Take 1  tablet by mouth daily.     oseltamivir (TAMIFLU) 75 MG capsule Take 1 capsule (75 mg total) by mouth every 12 (twelve) hours. (Patient not taking: Reported on 04/23/2020) 10 capsule 0   No current facility-administered medications on file prior to visit.    Physical Exam: -Vitals:  BP 122/74    Ht 5\' 7"  (1.702 m)    Wt 218 lb (98.9 kg)    LMP 05/18/2020    BMI 34.14 kg/m  GEN: WD, WN, NAD.  A+ O x 3, good mood and affect. ABD:  NT, ND.  Soft, no masses.  No hernias noted.   Pelvic:   Vulva: Normal appearance.  No lesions.  Vagina: No lesions or abnormalities noted.  Support: Normal pelvic support.  Urethra No masses tenderness or scarring.  Meatus Normal size without lesions or prolapse.  Cervix: See below.  Anus: Normal exam.  No lesions.  Perineum: Normal exam.  No lesions.        Bimanual   Uterus: Normal size.  Non-tender.  Mobile.  AV.  Adnexae: No masses.  Non-tender to palpation.  Cul-de-sac: Negative for abnormality.   PROCEDURE: 1.  Urine Pregnancy Test:  not done 2.  Colposcopy performed with 4% acetic acid after verbal consent  obtained                                         -Aceto-white Lesions Location(s):  diffusely              -Biopsy performed at 2, 6, 7, and 12 o'clock               -ECC indicated and performed: Yes.       -Biopsy sites made hemostatic with pressure, AgNO3, and/or Monsel's solution   -Satisfactory colposcopy: No.    -Evidence of Invasive cervical CA :  NO  ASSESSMENT:  AKEELA BUSK is a 39 y.o. G1P1001 here for  1. Atypical squamous cells cannot exclude high grade squamous intraepithelial lesion on cytologic smear of cervix (ASC-H)   .  PLAN: I discussed the grading system of pap smears and HPV high risk viral types.  We will discuss and base management after colpo results return.     Thomasene Mohair, MD  [facility], [facility] 05/26/2020  6:05 PM   CC: Alberteen Spindle, CNM [facility] [address] [place],  [place] [address]

## 2020-05-28 ENCOUNTER — Telehealth: Payer: Self-pay | Admitting: Obstetrics and Gynecology

## 2020-05-28 LAB — SURGICAL PATHOLOGY

## 2020-05-28 NOTE — Telephone Encounter (Signed)
Discussed findings in the context with her findings. Discussed repeat pap in 12 months vs LEEP.  After long discussion of risks, she elects to repeat pap smear in 12 months.  Discussed this follow up is important to decrease risk of developing cancer of cervix.  She has an odd mixture of findings. A six month follow up might also make sense. She will consider.

## 2020-05-28 NOTE — Telephone Encounter (Signed)
Left generic VM to call me back 

## 2020-06-05 ENCOUNTER — Encounter: Payer: Self-pay | Admitting: Physician Assistant

## 2020-06-05 NOTE — Progress Notes (Signed)
Reviewed patient's colpo findings from [facility].  Per Dr. Jean Rosenthal, the plan is to follow up with pap in 12 months or proceed with LEEP.  Per his note, he discussed options with patient and she opts to have repeat pap in 1 year, 05/2021.  Recommend that patient follow up with Dr. Jean Rosenthal per his plan.

## 2020-11-11 DIAGNOSIS — Z20822 Contact with and (suspected) exposure to covid-19: Secondary | ICD-10-CM | POA: Diagnosis not present

## 2021-05-14 ENCOUNTER — Telehealth: Payer: Self-pay | Admitting: Nurse Practitioner

## 2021-05-14 NOTE — Telephone Encounter (Signed)
Telephone call to patient regarding PAP follow-up.  Patient states that she has decided to complete PAP follow-up with [facility] and has an upcoming appointment.  Will obtain records after appointment. Glenna Fellows, RN

## 2021-06-05 ENCOUNTER — Ambulatory Visit: Payer: Medicaid Other | Admitting: Obstetrics and Gynecology

## 2021-06-11 ENCOUNTER — Encounter: Payer: Self-pay | Admitting: Obstetrics and Gynecology

## 2021-06-11 ENCOUNTER — Other Ambulatory Visit (HOSPITAL_COMMUNITY)
Admission: RE | Admit: 2021-06-11 | Discharge: 2021-06-11 | Disposition: A | Discharge status: Home / Self Care | Payer: Medicaid Other | Source: Ambulatory Visit | Attending: Obstetrics and Gynecology | Admitting: Obstetrics and Gynecology

## 2021-06-11 ENCOUNTER — Other Ambulatory Visit: Payer: Self-pay

## 2021-06-11 ENCOUNTER — Ambulatory Visit (INDEPENDENT_AMBULATORY_CARE_PROVIDER_SITE_OTHER): Payer: Medicaid Other | Admitting: Obstetrics and Gynecology

## 2021-06-11 VITALS — BP 110/78 | HR 87 | Resp 18 | Ht 67.0 in | Wt 229.4 lb

## 2021-06-11 DIAGNOSIS — Z1331 Encounter for screening for depression: Secondary | ICD-10-CM | POA: Diagnosis not present

## 2021-06-11 DIAGNOSIS — R87611 Atypical squamous cells cannot exclude high grade squamous intraepithelial lesion on cytologic smear of cervix (ASC-H): Secondary | ICD-10-CM

## 2021-06-11 DIAGNOSIS — Z01419 Encounter for gynecological examination (general) (routine) without abnormal findings: Secondary | ICD-10-CM | POA: Diagnosis not present

## 2021-06-11 DIAGNOSIS — Z1339 Encounter for screening examination for other mental health and behavioral disorders: Secondary | ICD-10-CM

## 2021-06-11 DIAGNOSIS — R232 Flushing: Secondary | ICD-10-CM | POA: Diagnosis not present

## 2021-06-11 NOTE — Progress Notes (Signed)
Gynecology Annual Exam  PCP: [facility]  Chief Complaint  Patient presents with   Gynecologic Exam   History of Present Illness:  [name] is a 40 y.o. G1P1001 who LMP was Patient's last menstrual period was 06/05/2021., presents today for her annual examination.  Her menses are regular every 28-30 days, lasting 1 day(s).  Dysmenorrhea none. She does not have intermenstrual bleeding.  She does have vasomotor sx. She stays hot.    She is not sexually active. She does not have vaginal dryness.  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 There is no FH of breast cancer. There is no FH of ovarian cancer. The patient does not do self-breast exams.  Colonoscopy: never had DEXA: has not been screened for osteoporosis  Tobacco use: vapes Alcohol use: social drinker Exercise: she walks every day. She works at [employer].  She works in Chief Strategy Officer.    The patient wears seatbelts: yes.     She wonders whether she could be perimenopausal. She has periods that last about a day. She has had weight gain in her abdominal area, about 20 pounds since December. She lost her father due to covid in that time.  She stopped taking birth control pills in may-June last year.    Past Medical History:  Diagnosis Date   ADD (attention deficit disorder)     Past Surgical History:  Procedure Laterality Date   NO PAST SURGERIES      Prior to Admission medications   Medication Sig Start Date End Date Taking? Authorizing Provider  fluticasone (FLONASE) 50 MCG/ACT nasal spray Place 2 sprays into both nostrils daily. 03/08/17 03/22/17  Renford Dills, NP  ibuprofen (ADVIL,MOTRIN) 800 MG tablet Take 1 tablet (800 mg total) by mouth 3 (three) times daily. 03/22/18   Lutricia Feil, PA-C    Allergies  Allergen Reactions   Penicillins Hives   Obstetric History:  G1P1001  Family History  Problem Relation Age of Onset   Cancer Mother    Heart disease Mother    Kidney disease Mother    Diabetes Father    Coronary artery disease Father    Heart disease Father    Hyperlipidemia Father    Hypertension Father    Diabetes Brother    ADD / ADHD Brother    Sleep apnea Brother    Heart disease Paternal Grandfather     Social History   Socioeconomic History   Marital status: Single    Spouse name: Not on file   Number of children: 1   Years of education: Not on file   Highest education level: Not on file  Occupational History   Not on file  Tobacco Use   Smoking status: Former    Pack years: 0.00   Smokeless tobacco: Never  Vaping Use   Vaping Use: Some days  Substance and Sexual Activity   Alcohol use: Yes    Comment: socially   Drug use: Not Currently   Sexual activity: Not Currently    Partners: Male  Other Topics Concern   Not on file  Social History Narrative   Not on file   Social Determinants of Health   Financial Resource Strain: Not on file  Food Insecurity: Not on file  Transportation Needs: Not on file  Physical Activity: Not on file  Stress: Not on file  Social  Connections: Not on file  Intimate Partner Violence: Not on file    Review of Systems  Constitutional: Negative.   HENT: Negative.    Eyes: Negative.   Respiratory: Negative.    Cardiovascular: Negative.   Gastrointestinal: Negative.   Genitourinary: Negative.   Musculoskeletal: Negative.   Skin: Negative.   Neurological: Negative.   Psychiatric/Behavioral: Negative.      Physical Exam BP 110/78   Pulse 87   Resp 18   Ht 5\' 7"  (1.702 m)   Wt 229 lb 6.4 oz (104.1 kg)   LMP 06/05/2021   SpO2 99%   BMI 35.93 kg/m   Physical Exam Constitutional:      General: She is not in acute distress.    Appearance: Normal appearance. She is well-developed.  Genitourinary:     Vulva and bladder normal.     Right Labia: No rash, tenderness, lesions,  skin changes or Bartholin's cyst.    Left Labia: No tenderness, lesions, skin changes, Bartholin's cyst or rash.    No inguinal adenopathy present in the right or left side.    Pelvic Tanner Score: 5/5.    No vaginal discharge, erythema, tenderness or bleeding.      Right Adnexa: not tender, not full and no mass present.    Left Adnexa: not tender, not full and no mass present.    No cervical motion tenderness, discharge, lesion or polyp.     Uterus is not enlarged or tender.     No uterine mass detected.    Pelvic exam was performed with patient in the lithotomy position.  Breasts:    Right: No inverted nipple, mass, nipple discharge, skin change or tenderness.     Left: No inverted nipple, mass, nipple discharge, skin change or tenderness.  HENT:     Head: Normocephalic and atraumatic.  Eyes:     General: No scleral icterus.    Conjunctiva/sclera: Conjunctivae normal.  Neck:     Thyroid: No thyromegaly.  Cardiovascular:     Rate and Rhythm: Normal rate and regular rhythm.     Heart sounds: No murmur heard.   No friction rub. No gallop.  Pulmonary:     Effort: Pulmonary effort is normal. No respiratory distress.     Breath sounds: Normal breath sounds. No wheezing or rales.  Abdominal:     General: Bowel sounds are normal. There is no distension.     Palpations: Abdomen is soft. There is no mass.     Tenderness: There is no abdominal tenderness. There is no guarding or rebound.     Hernia: There is no hernia in the left inguinal area or right inguinal area.  Musculoskeletal:        General: No swelling or tenderness. Normal range of motion.     Cervical back: Normal range of motion and neck supple.  Lymphadenopathy:     Cervical: No cervical adenopathy.     Lower Body: No right inguinal adenopathy. No left inguinal adenopathy.  Neurological:     General: No focal deficit present.     Mental Status: She is alert and oriented to person, place, and time.     Cranial Nerves:  No cranial nerve deficit.  Skin:    General: Skin is warm and dry.     Findings: No erythema or rash.  Psychiatric:        Mood and Affect: Mood normal.        Behavior: Behavior normal.  Judgment: Judgment normal.    Female chaperone present for pelvic and breast  portions of the physical exam  Results: AUDIT Questionnaire (screen for alcoholism): 1 PHQ-9: 6 GAD-7: 4  Assessment: 40 y.o. G68P1001 female here for routine gynecologic examination.  Plan: Problem List Items Addressed This Visit   None Visit Diagnoses     Women's annual routine gynecological examination    -  Primary   Relevant Orders   Cytology - PAP   Papanicolaou smear of cervix with atypical squamous cells cannot exclude high grade squamous intraepithelial lesion (ASC-H)       Relevant Orders   Cytology - PAP   Screening for depression       Screening for alcoholism       Hot flashes       Relevant Orders   TSH (Completed)   Follicle stimulating hormone (Completed)       Screening: -- Blood pressure screen normal -- Colonoscopy - not due -- Mammogram - due. Patient to call [place] to arrange. She understands that it is her responsibility to arrange this. -- Weight screening: overweight: continue to monitor -- Depression screening negative (PHQ-9) -- Nutrition: normal -- cholesterol screening: not due for screening -- osteoporosis screening: not due -- tobacco screening: using: discussed quitting using the 5 A's -- alcohol screening: AUDIT questionnaire indicates low-risk usage. -- family history of breast cancer screening: done. not at high risk. -- no evidence of domestic violence or intimate partner violence. -- STD screening: gonorrhea/chlamydia NAAT not collected per patient request. -- pap smear collected per ASCCP guidelines  Thomasene Mohair, MD 06/11/2021 3:28 PM

## 2021-06-12 ENCOUNTER — Encounter: Payer: Self-pay | Admitting: Obstetrics and Gynecology

## 2021-06-12 LAB — TSH: TSH: 0.587 u[IU]/mL (ref 0.450–4.500)

## 2021-06-12 LAB — FOLLICLE STIMULATING HORMONE: FSH: 7.1 m[IU]/mL

## 2021-06-17 LAB — CYTOLOGY - PAP
Comment: NEGATIVE
Diagnosis: NEGATIVE
High risk HPV: NEGATIVE

## 2021-06-23 DIAGNOSIS — H5213 Myopia, bilateral: Secondary | ICD-10-CM | POA: Diagnosis not present

## 2021-11-28 ENCOUNTER — Encounter: Payer: Self-pay | Admitting: Emergency Medicine

## 2021-11-28 ENCOUNTER — Ambulatory Visit
Admission: EM | Admit: 2021-11-28 | Discharge: 2021-11-28 | Disposition: A | Discharge status: Home / Self Care | Payer: Medicaid Other

## 2021-11-28 ENCOUNTER — Other Ambulatory Visit: Payer: Self-pay

## 2021-11-28 DIAGNOSIS — K0889 Other specified disorders of teeth and supporting structures: Secondary | ICD-10-CM

## 2021-11-28 MED ORDER — HYDROCODONE-ACETAMINOPHEN 5-325 MG PO TABS: 1.0000 | ORAL_TABLET | Freq: Four times a day (QID) | ORAL | 0 refills | Status: AC | PRN

## 2021-11-28 NOTE — ED Provider Notes (Signed)
[facility]    CSN: [id number] Arrival date & time: 11/28/21  1222      History   Chief Complaint Chief Complaint  Patient presents with   Dental Problem    HPI Katherine Johnston is a 40 y.o. female presenting for dental pain/jaw pain for the past 1-1/2 weeks.  Patient reports that she is seeing a dentist currently and has been on clindamycin and now doxycycline for dental infection.  Reports she had wisdom teeth removed years ago but the roots were left in and now the dentist believes she has an infection around the roots.  She is scheduled to have surgery in 3 days but says her pain has gotten worse.  She denies any fevers or facial swelling.  Reports pain when she opens her mouth.  Has tried over-the-counter ibuprofen and Tylenol and says it did not help.  She did take 1 hydrocodone from someone that she knows and says it helped.  HPI  Past Medical History:  Diagnosis Date   ADD (attention deficit disorder)     Patient Active Problem List   Diagnosis Date Noted   Obesity BMI=34.6 04/23/2020   ADHD--sees psych for meds 04/23/2020   Abnormal Pap smear of cervix 04/23/2020   History of drug use (MJ, cocaine, meth, acid) 04/23/2020   Anxiety disorder dx'd age 7 06/07/2016   Carbuncle and furuncle 06/07/2016   Malaise and fatigue 02/20/2009   Back ache 06/14/2008   Dupuytren's contracture of foot 06/14/2008   Lumbar back sprain 12/30/2006   Tobacco use 12/30/2006   Adiposity 07/14/2006   Allergic rhinitis 06/14/2006    Past Surgical History:  Procedure Laterality Date   NO PAST SURGERIES      OB History     Gravida  1   Para  1   Term  1   Preterm  0   AB  0   Living  1      SAB  0   IAB  0   Ectopic  0   Multiple  0   Live Births  1            Home Medications    Prior to Admission medications   Medication Sig Start Date End Date Taking? Authorizing Provider  doxycycline (VIBRA-TABS) 100 MG tablet SMARTSIG:1 Tablet(s) By  Mouth Every 12 Hours 11/26/21  Yes [provider]  HYDROcodone-acetaminophen (NORCO/VICODIN) 5-325 MG tablet Take 1 tablet by mouth every 6 (six) hours as needed for up to 3 days. 11/28/21 12/01/21 Yes Shirlee Latch, PA-C  ibuprofen (ADVIL,MOTRIN) 800 MG tablet Take 1 tablet (800 mg total) by mouth 3 (three) times daily. 03/22/18  Yes Lutricia Feil, PA-C  norethindrone-ethinyl estradiol (CYCLAFEM,ALYACEN) 0.5/0.75/1-35 MG-MCG tablet Take 1 tablet by mouth daily.   Yes [provider]  benzonatate (TESSALON) 200 MG capsule Take one cap TID PRN cough Patient not taking: Reported on 04/23/2020 03/22/18   Lutricia Feil, PA-C  fluticasone [facility]) 50 MCG/ACT nasal spray Place 2 sprays into both nostrils daily. 03/08/17 03/22/17  Renford Dills, NP  guaiFENesin-codeine (CHERATUSSIN AC) 100-10 MG/5ML syrup Take 5 mLs by mouth 3 (three) times daily as needed for cough. Patient not taking: Reported on 04/23/2020 03/22/18   Lutricia Feil, PA-C  oseltamivir (TAMIFLU) 75 MG capsule Take 1 capsule (75 mg total) by mouth every 12 (twelve) hours. Patient not taking: Reported on 04/23/2020 03/22/18   Lutricia Feil, PA-C    Family History Family History  Problem Relation Age of Onset   Cancer Mother    Heart disease Mother    Kidney disease Mother    Diabetes Father    Coronary artery disease Father    Heart disease Father    Hyperlipidemia Father    Hypertension Father    Diabetes Brother    ADD / ADHD Brother    Sleep apnea Brother    Heart disease Paternal Grandfather     Social History Social History   Tobacco Use   Smoking status: Former   Smokeless tobacco: Never  Building services engineer Use: Some days  Substance Use Topics   Alcohol use: Yes    Comment: socially   Drug use: Not Currently     Allergies   Penicillins   Review of Systems Review of Systems  Constitutional:  Negative for fatigue and fever.  HENT:  Positive for dental problem. Negative for  facial swelling.   Gastrointestinal:  Negative for nausea and vomiting.  Neurological:  Negative for dizziness, weakness and headaches.    Physical Exam Triage Vital Signs ED Triage Vitals  Enc Vitals Group     BP 11/28/21 1314 122/62     Pulse Rate 11/28/21 1314 85     Resp 11/28/21 1314 14     Temp 11/28/21 1314 98.6 F (37 C)     Temp Source 11/28/21 1314 Oral     SpO2 11/28/21 1314 99 %     Weight 11/28/21 1311 225 lb (102.1 kg)     Height 11/28/21 1311 5\' 7"  (1.702 m)     Head Circumference --      Peak Flow --      Pain Score 11/28/21 1311 7     Pain Loc --      Pain Edu? --      Excl. in GC? --    No data found.  Updated Vital Signs BP 122/62 (BP Location: Left Arm)    Pulse 85    Temp 98.6 F (37 C) (Oral)    Resp 14    Ht 5\' 7"  (1.702 m)    Wt 225 lb (102.1 kg)    LMP 11/07/2021 (Approximate)    SpO2 99%    BMI 35.24 kg/m      Physical Exam Vitals and nursing note reviewed.  Constitutional:      General: She is not in acute distress.    Appearance: Normal appearance. She is not ill-appearing or toxic-appearing.  HENT:     Head: Normocephalic and atraumatic.     Nose: Nose normal.     Mouth/Throat:     Mouth: Mucous membranes are moist.     Pharynx: Oropharynx is clear.  Eyes:     General: No scleral icterus.       Right eye: No discharge.        Left eye: No discharge.     Conjunctiva/sclera: Conjunctivae normal.  Cardiovascular:     Rate and Rhythm: Normal rate and regular rhythm.     Heart sounds: Normal heart sounds.  Pulmonary:     Effort: Pulmonary effort is normal. No respiratory distress.     Breath sounds: Normal breath sounds.  Musculoskeletal:     Cervical back: Neck supple.  Skin:    General: Skin is dry.  Neurological:     General: No focal deficit present.     Mental Status: She is alert. Mental status is at baseline.     Motor: No weakness.  Gait: Gait normal.  Psychiatric:        Mood and Affect: Mood normal.         Behavior: Behavior normal.        Thought Content: Thought content normal.     UC Treatments / Results  Labs (all labs ordered are listed, but only abnormal results are displayed) Labs Reviewed - No data to display  EKG   Radiology No results found.  Procedures Procedures (including critical care time)  Medications Ordered in UC Medications - No data to display  Initial Impression / Assessment and Plan / UC Course  I have reviewed the triage vital signs and the nursing notes.  Pertinent labs & imaging results that were available during my care of the patient were reviewed by me and considered in my medical decision making (see chart for details).  40 year old female presenting for dental pain.  Patient is seeing a dentist regarding the dental pain.  Currently on doxycycline.  No associated fever or facial swelling.  Vitals are normal and stable.  She is overall well-appearing but does hold her jaw and pain.  Examination of teeth and mouth is grossly normal.  Patient reports the infection is deeper in the jaw.  Surgery in 3 days.  Patient given short supply of hydrocodone after reviewing controlled substance database and finding her to be low risk for abuse.  Advised to keep appointment with dentist.  Advised to go to ED if fever, facial swelling or worsening pain.   Final Clinical Impressions(s) / UC Diagnoses   Final diagnoses:  Pain, dental     Discharge Instructions      -Continue your antibiotics.  I have sent a couple days of pain medication to get through till the surgery.  They can prescribe more if they see fit after the surgery. - Go to ER for any fever, increased dental pain, facial swelling, etc.     ED Prescriptions     Medication Sig Dispense Auth. Provider   HYDROcodone-acetaminophen (NORCO/VICODIN) 5-325 MG tablet Take 1 tablet by mouth every 6 (six) hours as needed for up to 3 days. 10 tablet Shirlee Latch, PA-C      I have reviewed the PDMP  during this encounter.   Shirlee Latch, PA-C 11/28/21 1431

## 2021-11-28 NOTE — ED Triage Notes (Signed)
Patient states that she has been having right lower jaw pain that started a week ago.  Patient states that her pain has gotten worse.  Patient states that she is scheduled for surgery on Tuesday.  Patient states that Ibuprofen has not helped with her pain.

## 2021-11-28 NOTE — Discharge Instructions (Addendum)
-  Continue your antibiotics.  I have sent a couple days of pain medication to get through till the surgery.  They can prescribe more if they see fit after the surgery. - Go to ER for any fever, increased dental pain, facial swelling, etc.

## 2021-12-06 HISTORY — PX: MANDIBLE SURGERY: SHX707

## 2022-03-16 ENCOUNTER — Telehealth: Payer: Self-pay

## 2022-03-16 NOTE — Telephone Encounter (Signed)
Pt needing a refill on her OCP's, she was last seen by SDJ in 06/2021. We did not initially RX the medication. Can you send in refills for pt OCP to last her until next annual due?  ?

## 2022-03-17 ENCOUNTER — Telehealth: Payer: Self-pay | Admitting: Family Medicine

## 2022-03-17 NOTE — Telephone Encounter (Signed)
Pt had PE done at [facility] and is not due until July, but ran out her [facility] pills. Can someone call her to see if she can get a month supply or so? We do not have any available appts this week. Thanks ?

## 2022-03-17 NOTE — Telephone Encounter (Signed)
Can you schedule an appt for pt per lydia please?  ?

## 2022-03-17 NOTE — Telephone Encounter (Signed)
Patient is scheduled for 03/19/22 with LMD 

## 2022-03-19 ENCOUNTER — Ambulatory Visit (INDEPENDENT_AMBULATORY_CARE_PROVIDER_SITE_OTHER): Payer: Medicaid Other | Admitting: Licensed Practical Nurse

## 2022-03-19 ENCOUNTER — Encounter: Payer: Self-pay | Admitting: Licensed Practical Nurse

## 2022-03-19 VITALS — BP 110/80 | Ht 67.0 in | Wt 226.0 lb

## 2022-03-19 DIAGNOSIS — Z308 Encounter for other contraceptive management: Secondary | ICD-10-CM | POA: Diagnosis not present

## 2022-03-19 MED ORDER — NORETHIN-ETH ESTRAD TRIPHASIC 0.5/0.75/1-35 MG-MCG PO TABS: 1.0000 | ORAL_TABLET | Freq: Every day | ORAL | 4 refills | Status: DC

## 2022-03-19 NOTE — Progress Notes (Signed)
? ? ? ?  Obstetrics & Gynecology Office Visit  ? ?Chief Complaint:  ?Chief Complaint  ?Patient presents with  ? Contraception  ?  OCPs  ? ? ?History of Present Illness: Here for a refill on her birth control. Was instructed by this CNM to come in as smoking hx was noted in her chart. Has been on OCP's for 20 years, is happy with this choice.  States she has stopped vaping 6 months ago,  Stopped cigarette smoking over 15 years ago. Denies any hx of Migraines.  ? ? ?Review of Systems: NA  ? ?Past Medical History:  ?Past Medical History:  ?Diagnosis Date  ? ADD (attention deficit disorder)   ? ? ?Past Surgical History:  ?Past Surgical History:  ?Procedure Laterality Date  ? NO PAST SURGERIES    ? ? ?Gynecologic History: Patient's last menstrual period was 03/12/2022 (exact date). ? ?Obstetric History: G1P1001 ? ?Family History:  ?Family History  ?Problem Relation Age of Onset  ? Heart disease Mother   ? Kidney disease Mother   ? Endometrial cancer Mother 76  ? Diabetes Father   ? Coronary artery disease Father   ? Heart disease Father   ? Hyperlipidemia Father   ? Hypertension Father   ? Diabetes Brother   ? ADD / ADHD Brother   ? Sleep apnea Brother   ? Heart disease Paternal Grandfather   ? ? ?Social History:  ?Social History  ? ?Socioeconomic History  ? Marital status: Single  ?  Spouse name: Not on file  ? Number of children: 1  ? Years of education: Not on file  ? Highest education level: Not on file  ?Occupational History  ? Not on file  ?Tobacco Use  ? Smoking status: Former  ? Smokeless tobacco: Never  ?Vaping Use  ? Vaping Use: Some days  ?Substance and Sexual Activity  ? Alcohol use: Yes  ?  Comment: socially  ? Drug use: Not Currently  ? Sexual activity: Not Currently  ?  Partners: Male  ?  Birth control/protection: None  ?Other Topics Concern  ? Not on file  ?Social History Narrative  ? Not on file  ? ?Social Determinants of Health  ? ?Financial Resource Strain: Not on file  ?Food Insecurity: Not on file   ?Transportation Needs: Not on file  ?Physical Activity: Not on file  ?Stress: Not on file  ?Social Connections: Not on file  ?Intimate Partner Violence: Not on file  ? ? ?Allergies:  ?Allergies  ?Allergen Reactions  ? Penicillins Hives  ? Penicillin V Hives  ? ? ?Medications: ?Prior to Admission medications   ?Medication Sig Start Date End Date Taking? Authorizing Provider  ?norethindrone-ethinyl estradiol (CYCLAFEM) 0.5/0.75/1-35 MG-MCG tablet Take 1 tablet by mouth daily. 03/19/22   Graylon Amory, Courtney Heys, CNM  ? ? ?Physical Exam ?Vitals:  ?Vitals:  ? 03/19/22 1021  ?BP: 110/80  ? ?Patient's last menstrual period was 03/12/2022 (exact date). ? ?General: NAD ?Psychiatric: mood appropriate, affect full ? ?Female chaperone present for pelvic  portions of the physical exam ? ?Assessment: 41 y.o. G1P1001 contraception management  ? ?Plan: ?Problem List Items Addressed This Visit   ?None ?Visit Diagnoses   ? ? Encounter for other contraceptive management    -  Primary  ? Relevant Medications  ? norethindrone-ethinyl estradiol (CYCLAFEM) 0.5/0.75/1-35 MG-MCG tablet  ? ?  ? ? ?Carie Caddy, CNM  ?Domingo Pulse, [facility]  ?03/19/22  ?10:49 AM  ? ?  ?

## 2022-03-22 ENCOUNTER — Telehealth: Payer: Self-pay

## 2022-03-22 ENCOUNTER — Other Ambulatory Visit: Payer: Self-pay | Admitting: Licensed Practical Nurse

## 2022-03-22 DIAGNOSIS — Z3041 Encounter for surveillance of contraceptive pills: Secondary | ICD-10-CM

## 2022-03-22 MED ORDER — NORGESTIMATE-ETH ESTRADIOL 0.25-35 MG-MCG PO TABS: 1.0000 | ORAL_TABLET | Freq: Every day | ORAL | 11 refills | Status: DC

## 2022-03-22 NOTE — Telephone Encounter (Signed)
Pt calling triage stating that her RX was supposed to be the [facility] OCP's and requesting that one be sent in to pharm.  ?

## 2022-03-26 ENCOUNTER — Ambulatory Visit
Admission: EM | Admit: 2022-03-26 | Discharge: 2022-03-26 | Disposition: A | Discharge status: Home / Self Care | Payer: Medicaid Other | Attending: Internal Medicine | Admitting: Internal Medicine

## 2022-03-26 DIAGNOSIS — M25552 Pain in left hip: Secondary | ICD-10-CM

## 2022-03-26 DIAGNOSIS — S92309A Fracture of unspecified metatarsal bone(s), unspecified foot, initial encounter for closed fracture: Secondary | ICD-10-CM

## 2022-03-26 DIAGNOSIS — S93409A Sprain of unspecified ligament of unspecified ankle, initial encounter: Secondary | ICD-10-CM | POA: Insufficient documentation

## 2022-03-26 DIAGNOSIS — J309 Allergic rhinitis, unspecified: Secondary | ICD-10-CM

## 2022-03-26 HISTORY — DX: Fracture of unspecified metatarsal bone(s), unspecified foot, initial encounter for closed fracture: S92.309A

## 2022-03-26 MED ORDER — METHOCARBAMOL 500 MG PO TABS: 500.0000 mg | ORAL_TABLET | Freq: Every evening | ORAL | 0 refills | Status: DC | PRN

## 2022-03-26 MED ORDER — FLUTICASONE PROPIONATE 50 MCG/ACT NA SUSP: 1.0000 | Freq: Every day | NASAL | 0 refills | Status: DC

## 2022-03-26 NOTE — ED Provider Notes (Signed)
?[facility] ? ? ? ?CSN: [id number] ?Arrival date & time: 03/26/22  1412 ? ? ?  ? ?History   ?Chief Complaint ?Chief Complaint  ?Patient presents with  ? Nasal Congestion  ?  Feels like sinus infection but also with body aches - Entered by patient  ? ? ?HPI ?Katherine Johnston is a 41 y.o. female comes to urgent care with sinus pain over the right side of the face which started about 4 days ago.  [place]  The next day patient started experiencing sore throat right-sided facial pain and right ear pain as well as sinus pressure.  She denies any fever or chills.  No shortness of breath or wheezing.  She had some cough which was nonproductive.  No generalized body aches.  Patient has a history of seasonal allergies but does not use any medications at this time.  She has tried vapor rub and humidifier use which has helped his symptoms somewhat.  She denies any itchy eyes nose or throat.  She has had some discomfort in the right ear with itching.  No sick contacts.  No nausea vomiting or diarrhea.  Patient tested negative using the home COVID test. ? ?Patient sustained a mechanical fall 3 weeks ago.  She fell on her buttocks.  Since then she has had some left hip pain.  Pain is of mild to moderate severity and associated with stiffness in the left hip area.  Stiffness is relieved when patient walks around.  She also has some muscle tightness around the left hip and left shoulder.  She is able to bear weight on the left hip with no difficulty.  No limping. ? ?HPI ? ?Past Medical History:  ?Diagnosis Date  ? ADD (attention deficit disorder)   ? ? ?Patient Active Problem List  ? Diagnosis Date Noted  ? Sprain of ankle 03/26/2022  ? Closed fracture of metatarsal bone 03/26/2022  ? Obesity BMI=34.6 04/23/2020  ? ADHD--sees psych for meds 04/23/2020  ? Abnormal Pap smear of cervix 04/23/2020  ? History of drug use (MJ, cocaine, meth, acid) 04/23/2020  ? Anxiety disorder dx'd age 82  06/07/2016  ? Carbuncle and furuncle 06/07/2016  ? Malaise and fatigue 02/20/2009  ? Back ache 06/14/2008  ? Dupuytren's contracture of foot 06/14/2008  ? Lumbar back sprain 12/30/2006  ? Tobacco use 12/30/2006  ? Adiposity 07/14/2006  ? Allergic rhinitis 06/14/2006  ? ? ?Past Surgical History:  ?Procedure Laterality Date  ? NO PAST SURGERIES    ? ? ?OB History   ? ? Gravida  ?1  ? Para  ?1  ? Term  ?1  ? Preterm  ?0  ? AB  ?0  ? Living  ?1  ?  ? ? SAB  ?0  ? IAB  ?0  ? Ectopic  ?0  ? Multiple  ?0  ? Live Births  ?1  ?   ?  ?  ? ? ? ?Home Medications   ? ?Prior to Admission medications   ?Medication Sig Start Date End Date Taking? Authorizing Provider  ?aspirin-acetaminophen-caffeine (EXCEDRIN MIGRAINE) 250-250-65 MG tablet Take 1 tablet by mouth every 6 (six) hours as needed for headache.   Yes [provider]  ?fluticasone (FLONASE) 50 MCG/ACT nasal spray Place 1 spray into both nostrils daily. 03/26/22  Yes Yousif Edelson, Britta Mccreedy, MD  ?methocarbamol (ROBAXIN) 500 MG tablet Take 1 tablet (500 mg total) by mouth at bedtime as needed for muscle spasms.  03/26/22  Yes Kupono Marling, Britta MccreedyPhilip O, MD  ?norgestimate-ethinyl estradiol (ESTARYLLA) 0.25-35 MG-MCG tablet Take 1 tablet by mouth daily. 03/22/22  Yes Dominic, Courtney HeysLydia Marie, CNM  ?ibuprofen (ADVIL) 600 MG tablet Take 600 mg by mouth every 6 (six) hours. 03/17/22   [provider]  ? ? ?Family History ?Family History  ?Problem Relation Age of Onset  ? Heart disease Mother   ? Kidney disease Mother   ? Endometrial cancer Mother [id number]  ? Diabetes Father   ? Coronary artery disease Father   ? Heart disease Father   ? Hyperlipidemia Father   ? Hypertension Father   ? Diabetes Brother   ? ADD / ADHD Brother   ? Sleep apnea Brother   ? Heart disease Paternal Grandfather   ? ? ?Social History ?Social History  ? ?Tobacco Use  ? Smoking status: Former  ? Smokeless tobacco: Never  ?Vaping Use  ? Vaping Use: Former  ?Substance Use Topics  ? Alcohol use: Yes  ?  Comment: socially   ? Drug use: Not Currently  ? ? ? ?Allergies   ?Penicillins and Penicillin v ? ? ?Review of Systems ?Review of Systems ?As per HPI ? ?Physical Exam ?Triage Vital Signs ?ED Triage Vitals  ?Enc Vitals Group  ?   BP 03/26/22 1418 132/76  ?   Pulse Rate 03/26/22 1418 69  ?   Resp 03/26/22 1418 18  ?   Temp 03/26/22 1418 98.2 ?F (36.8 ?C)  ?   Temp Source 03/26/22 1418 Oral  ?   SpO2 03/26/22 1418 100 %  ?   Weight 03/26/22 1415 220 lb (99.8 kg)  ?   Height 03/26/22 1415 5\' 7"  (1.702 m)  ?   Head Circumference --   ?   Peak Flow --   ?   Pain Score 03/26/22 1414 0  ?   Pain Loc --   ?   Pain Edu? --   ?   Excl. in GC? --   ? ?No data found. ? ?Updated Vital Signs ?BP 132/76 (BP Location: Left Arm)   Pulse 69   Temp 98.2 ?F (36.8 ?C) (Oral)   Resp 18   Ht 5\' 7"  (1.702 m)   Wt 99.8 kg   LMP 03/12/2022 (Exact Date)   SpO2 100%   BMI 34.46 kg/m?  ? ?Visual Acuity ?Right Eye Distance:   ?Left Eye Distance:   ?Bilateral Distance:   ? ?Right Eye Near:   ?Left Eye Near:    ?Bilateral Near:    ? ?Physical Exam ?Vitals and nursing note reviewed.  ?Constitutional:   ?   General: She is not in acute distress. ?   Appearance: Normal appearance. She is not ill-appearing.  ?HENT:  ?   Right Ear: Tympanic membrane normal.  ?   Left Ear: Tympanic membrane normal.  ?   Mouth/Throat:  ?   Pharynx: No posterior oropharyngeal erythema.  ?Cardiovascular:  ?   Rate and Rhythm: Normal rate and regular rhythm.  ?   Pulses: Normal pulses.  ?   Heart sounds: Normal heart sounds.  ?Pulmonary:  ?   Effort: Pulmonary effort is normal.  ?   Breath sounds: Normal breath sounds.  ?Abdominal:  ?   General: Bowel sounds are normal.  ?   Palpations: Abdomen is soft.  ?Musculoskeletal:     ?   General: No swelling, tenderness or signs of injury. Normal range of motion.  ?Neurological:  ?   General: No  focal deficit present.  ?   Mental Status: She is alert and oriented to person, place, and time.  ? ? ? ?UC Treatments / Results  ?Labs ?(all labs  ordered are listed, but only abnormal results are displayed) ?Labs Reviewed - No data to display ? ?EKG ? ? ?Radiology ?No results found. ? ?Procedures ?Procedures (including critical care time) ? ?Medications Ordered in UC ?Medications - No data to display ? ?Initial Impression / Assessment and Plan / UC Course  ?I have reviewed the triage vital signs and the nursing notes. ? ?Pertinent labs & imaging results that were available during my care of the patient were reviewed by me and considered in my medical decision making (see chart for details). ? ?  ? ?1.  Allergic rhinosinusitis: ?Flonase ?Over-the-counter Claritin ?Saline nasal spray ?Continue humidifier and vapor rub use ?Tylenol as needed for pain ? ?2.  Left hip and shoulder pain, musculoskeletal: ?Muscle relaxant as needed at bedtime ?Anti-inflammatory agents ?Heating pad use ?Gentle stretching exercises ?Return precautions given. ?Final Clinical Impressions(s) / UC Diagnoses  ? ?Final diagnoses:  ?Allergic sinusitis  ?Pain of left hip  ? ? ? ?Discharge Instructions   ? ?  ?Please use medications as prescribed ?Humidifier use and vapor rub use ?Gentle stretching exercises of your left hip and the left shoulder ?Continue taking anti-inflammatories and take over-the-counter Claritin or Zyrtec. ?Please do not drive or operate heavy machinery after taking muscle relaxants ?Heating pad use only 20 minutes on-20 minutes off cycle ?Return to urgent care if symptoms worsen. ? ? ?ED Prescriptions   ? ? Medication Sig Dispense Auth. Provider  ? fluticasone (FLONASE) 50 MCG/ACT nasal spray Place 1 spray into both nostrils daily. 16 g Gilles Trimpe, Britta Mccreedy, MD  ? methocarbamol (ROBAXIN) 500 MG tablet Take 1 tablet (500 mg total) by mouth at bedtime as needed for muscle spasms. 10 tablet Shahan Starks, Britta Mccreedy, MD  ? ?  ? ?PDMP not reviewed this encounter. ?  ?Merrilee Jansky, MD ?03/26/22 1441 ? ?

## 2022-03-26 NOTE — ED Triage Notes (Signed)
Patient is here for "possible sinus infection". Sinus pain/pressure "all week". No fever. [place]  ?

## 2022-03-26 NOTE — Discharge Instructions (Addendum)
Please use medications as prescribed ?Humidifier use and vapor rub use ?Gentle stretching exercises of your left hip and the left shoulder ?Continue taking anti-inflammatories and take over-the-counter Claritin or Zyrtec. ?Please do not drive or operate heavy machinery after taking muscle relaxants ?Heating pad use only 20 minutes on-20 minutes off cycle ?Return to urgent care if symptoms worsen. ?

## 2022-03-26 NOTE — ED Triage Notes (Signed)
@   home COVID19 test "Negative". ?

## 2022-11-04 ENCOUNTER — Other Ambulatory Visit: Payer: Self-pay | Admitting: Licensed Practical Nurse

## 2022-11-04 DIAGNOSIS — Z3041 Encounter for surveillance of contraceptive pills: Secondary | ICD-10-CM

## 2022-11-07 ENCOUNTER — Ambulatory Visit (INDEPENDENT_AMBULATORY_CARE_PROVIDER_SITE_OTHER): Payer: Medicaid Other

## 2022-11-07 ENCOUNTER — Ambulatory Visit
Admission: EM | Admit: 2022-11-07 | Discharge: 2022-11-07 | Disposition: A | Discharge status: Home / Self Care | Payer: Medicaid Other | Attending: Family Medicine | Admitting: Family Medicine

## 2022-11-07 DIAGNOSIS — M25571 Pain in right ankle and joints of right foot: Secondary | ICD-10-CM | POA: Diagnosis not present

## 2022-11-07 DIAGNOSIS — S93491A Sprain of other ligament of right ankle, initial encounter: Secondary | ICD-10-CM | POA: Diagnosis not present
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
## 2022-11-07 NOTE — ED Triage Notes (Signed)
Patient states she rolled it Friday night and is having 8/10 pain when trying to put pressure or walk on her ankle. Taking ibuprofen to help with the pain.

## 2022-11-07 NOTE — Discharge Instructions (Addendum)
No fracture or dislocation was seen on your x-ray today.  For your  pain, Take 1500 mg Tylenol twice a day, Ibuprofen 2-3 times a day,  as needed for pain. Apply a compressive ACE bandage. Rest and elevate the affected painful area.  Apply cold compresses intermittently, as needed.  As pain recedes, begin normal activities slowly as tolerated.  Follow up with primary care provider or an orthopedic provider, if symptoms persist.  Watch for worsening symptoms such as an increasing weakness or loss of sensation, increasing pain and/or the loss of bladder or bowel function. Should any of these occur, go to the emergency department immediately.

## 2023-01-11 ENCOUNTER — Other Ambulatory Visit: Payer: Self-pay

## 2023-01-11 ENCOUNTER — Encounter: Payer: Self-pay | Admitting: Licensed Practical Nurse

## 2023-01-11 DIAGNOSIS — Z3041 Encounter for surveillance of contraceptive pills: Secondary | ICD-10-CM

## 2023-01-27 DIAGNOSIS — R52 Pain, unspecified: Secondary | ICD-10-CM | POA: Diagnosis not present

## 2023-01-27 DIAGNOSIS — S32018A Other fracture of first lumbar vertebra, initial encounter for closed fracture: Secondary | ICD-10-CM | POA: Diagnosis not present

## 2023-01-27 DIAGNOSIS — Z041 Encounter for examination and observation following transport accident: Secondary | ICD-10-CM | POA: Diagnosis not present

## 2023-01-27 DIAGNOSIS — R000 Tachycardia, unspecified: Secondary | ICD-10-CM | POA: Diagnosis not present

## 2023-01-27 DIAGNOSIS — S80211A Abrasion, right knee, initial encounter: Secondary | ICD-10-CM | POA: Diagnosis not present

## 2023-01-27 DIAGNOSIS — S8992XA Unspecified injury of left lower leg, initial encounter: Secondary | ICD-10-CM | POA: Diagnosis not present

## 2023-01-27 DIAGNOSIS — M4856XA Collapsed vertebra, not elsewhere classified, lumbar region, initial encounter for fracture: Secondary | ICD-10-CM | POA: Diagnosis not present

## 2023-01-27 DIAGNOSIS — R079 Chest pain, unspecified: Secondary | ICD-10-CM | POA: Diagnosis not present

## 2023-01-27 DIAGNOSIS — S80212A Abrasion, left knee, initial encounter: Secondary | ICD-10-CM | POA: Diagnosis not present

## 2023-01-27 DIAGNOSIS — R29890 Loss of height: Secondary | ICD-10-CM | POA: Diagnosis not present

## 2023-01-27 DIAGNOSIS — S32009A Unspecified fracture of unspecified lumbar vertebra, initial encounter for closed fracture: Secondary | ICD-10-CM | POA: Diagnosis not present

## 2023-01-27 DIAGNOSIS — R040 Epistaxis: Secondary | ICD-10-CM | POA: Diagnosis not present

## 2023-01-27 DIAGNOSIS — R0789 Other chest pain: Secondary | ICD-10-CM | POA: Diagnosis not present

## 2023-01-27 DIAGNOSIS — R109 Unspecified abdominal pain: Secondary | ICD-10-CM | POA: Diagnosis not present

## 2023-01-27 DIAGNOSIS — S299XXA Unspecified injury of thorax, initial encounter: Secondary | ICD-10-CM | POA: Diagnosis not present

## 2023-01-27 DIAGNOSIS — S50312A Abrasion of left elbow, initial encounter: Secondary | ICD-10-CM | POA: Diagnosis not present

## 2023-01-28 DIAGNOSIS — R000 Tachycardia, unspecified: Secondary | ICD-10-CM | POA: Diagnosis not present

## 2023-01-31 ENCOUNTER — Ambulatory Visit
Admission: RE | Admit: 2023-01-31 | Discharge: 2023-01-31 | Disposition: A | Discharge status: Home / Self Care | Payer: Medicaid Other | Source: Ambulatory Visit | Attending: Physician Assistant | Admitting: Physician Assistant

## 2023-01-31 VITALS — BP 110/77 | HR 106 | Temp 98.1°F | Resp 20

## 2023-01-31 DIAGNOSIS — S161XXA Strain of muscle, fascia and tendon at neck level, initial encounter: Secondary | ICD-10-CM | POA: Diagnosis not present

## 2023-01-31 DIAGNOSIS — M545 Low back pain, unspecified: Secondary | ICD-10-CM | POA: Diagnosis not present

## 2023-01-31 DIAGNOSIS — T07XXXA Unspecified multiple injuries, initial encounter: Secondary | ICD-10-CM | POA: Diagnosis not present

## 2023-01-31 DIAGNOSIS — S32010A Wedge compression fracture of first lumbar vertebra, initial encounter for closed fracture: Secondary | ICD-10-CM

## 2023-01-31 MED ORDER — HYDROCODONE-ACETAMINOPHEN 10-325 MG PO TABS: 1.0000 | ORAL_TABLET | Freq: Four times a day (QID) | ORAL | 0 refills | Status: AC | PRN

## 2023-01-31 MED ORDER — NAPROXEN 500 MG PO TABS: 500.0000 mg | ORAL_TABLET | Freq: Two times a day (BID) | ORAL | 0 refills | Status: AC

## 2023-01-31 MED ORDER — TIZANIDINE HCL 4 MG PO TABS: 4.0000 mg | ORAL_TABLET | Freq: Three times a day (TID) | ORAL | 0 refills | Status: AC | PRN
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
## 2023-01-31 NOTE — Discharge Instructions (Addendum)
-  I have sent multiple medications to the pharmacy for you.  Take naproxen twice a day as needed for pain, tizanidine 3 times a day as needed for muscle spasms and Norco 1 tablet every 6 hours as needed for severe/breakthrough pain. - Also use muscle rubs, heat, ice, lidocaine patches. - Contact when the offices below for follow-up. - We will be unable to provide additional refills for the narcotic pain medications to make sure you follow-up with orthopedics as soon as possible.  You have a condition requiring you to follow up with Orthopedics so please call one of the following office for appointment:   [facility] [address] [place], [place] [address] Phone: [phone]  [facility] [address] [place], [place] [address] Phone: [phone]

## 2023-02-04 DIAGNOSIS — S8002XA Contusion of left knee, initial encounter: Secondary | ICD-10-CM | POA: Insufficient documentation

## 2023-02-04 DIAGNOSIS — S32000A Wedge compression fracture of unspecified lumbar vertebra, initial encounter for closed fracture: Secondary | ICD-10-CM | POA: Insufficient documentation

## 2023-02-04 DIAGNOSIS — M4856XA Collapsed vertebra, not elsewhere classified, lumbar region, initial encounter for fracture: Secondary | ICD-10-CM | POA: Diagnosis not present

## 2023-02-15 ENCOUNTER — Encounter: Payer: Self-pay | Admitting: Family Medicine

## 2023-02-16 ENCOUNTER — Telehealth: Payer: Self-pay

## 2023-02-16 NOTE — Telephone Encounter (Signed)
Patient was very unhappy with the response that she received on her voice mail, and requested that the nurse give her a call back about her canceled appointment.

## 2023-02-17 ENCOUNTER — Ambulatory Visit (INDEPENDENT_AMBULATORY_CARE_PROVIDER_SITE_OTHER): Payer: Medicaid Other | Admitting: Family Medicine

## 2023-02-17 ENCOUNTER — Encounter: Payer: Medicaid Other | Admitting: Family Medicine

## 2023-02-17 ENCOUNTER — Encounter: Payer: Self-pay | Admitting: Family Medicine

## 2023-02-17 VITALS — BP 130/88 | HR 99 | Ht 67.0 in

## 2023-02-17 DIAGNOSIS — S32010A Wedge compression fracture of first lumbar vertebra, initial encounter for closed fracture: Secondary | ICD-10-CM

## 2023-02-17 DIAGNOSIS — M2392 Unspecified internal derangement of left knee: Secondary | ICD-10-CM

## 2023-02-17 MED ORDER — DICLOFENAC SODIUM 75 MG PO TBEC: 75.0000 mg | DELAYED_RELEASE_TABLET | Freq: Two times a day (BID) | ORAL | 0 refills | Status: DC

## 2023-02-17 NOTE — Assessment & Plan Note (Signed)
See overview details, patient had radiographs of the left knee which were negative for acute osseous injury, report is only available for review.  Continues to note primarily anterior pain with mild radiation proximally to the lateral thigh, aggravated with weightbearing, utilizing hinged knee brace and walker for weightbearing assistance.  Examination with trace effusion, equivocal laxity during anterior drawer, contralateral with firm endpoint, [place] localizes laterally, negative valgus/varus stressing, diffusely tender, pain with flexion beyond 90 degrees, no mechanical obstruction noted.  Given the mechanism of injury, findings today, will plan for MRI left knee without contrast, advised continued hinged knee brace, ambulatory assistance, start of diclofenac twice daily with low threshold to advance to prednisone course, close follow-up in 2 weeks.

## 2023-02-17 NOTE — Patient Instructions (Signed)
-   Start diclofenac twice daily with food - If ineffective after a 3 days, start steroid and take for full course then revert to twice daily diclofenac - Use knee brace while on your feet - Maintain follow-up with spine group - Return as scheduled in 2 weeks
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
## 2023-02-18 ENCOUNTER — Telehealth: Payer: Self-pay | Admitting: Family Medicine

## 2023-02-18 NOTE — Telephone Encounter (Signed)
Can you help with this? Need a new referral

## 2023-02-18 NOTE — Telephone Encounter (Signed)
Pt is calling to report that she is has found a pain management that can see her sooner. [employer] Fax289 [phone]  Phone [phone] CB Patient [phone]

## 2023-02-21 DIAGNOSIS — M4856XA Collapsed vertebra, not elsewhere classified, lumbar region, initial encounter for fracture: Secondary | ICD-10-CM | POA: Diagnosis not present

## 2023-03-02 ENCOUNTER — Ambulatory Visit
Admission: RE | Admit: 2023-03-02 | Discharge: 2023-03-02 | Disposition: A | Discharge status: Home / Self Care | Payer: Medicaid Other | Source: Ambulatory Visit | Attending: Internal Medicine | Admitting: Internal Medicine

## 2023-03-02 ENCOUNTER — Other Ambulatory Visit (HOSPITAL_COMMUNITY): Payer: Medicaid Other

## 2023-03-02 DIAGNOSIS — M2392 Unspecified internal derangement of left knee: Secondary | ICD-10-CM | POA: Diagnosis not present

## 2023-03-02 DIAGNOSIS — Z5181 Encounter for therapeutic drug level monitoring: Secondary | ICD-10-CM | POA: Diagnosis not present

## 2023-03-02 DIAGNOSIS — M533 Sacrococcygeal disorders, not elsewhere classified: Secondary | ICD-10-CM | POA: Diagnosis not present

## 2023-03-02 DIAGNOSIS — Z79899 Other long term (current) drug therapy: Secondary | ICD-10-CM | POA: Diagnosis not present

## 2023-03-02 DIAGNOSIS — M25562 Pain in left knee: Secondary | ICD-10-CM | POA: Diagnosis not present

## 2023-03-02 DIAGNOSIS — S32009A Unspecified fracture of unspecified lumbar vertebra, initial encounter for closed fracture: Secondary | ICD-10-CM | POA: Diagnosis not present

## 2023-03-04 ENCOUNTER — Encounter: Payer: Self-pay | Admitting: Family Medicine

## 2023-03-04 ENCOUNTER — Ambulatory Visit (INDEPENDENT_AMBULATORY_CARE_PROVIDER_SITE_OTHER): Payer: Medicaid Other | Admitting: Family Medicine

## 2023-03-04 VITALS — BP 120/76 | HR 86 | Resp 96 | Ht 67.0 in | Wt 216.0 lb

## 2023-03-04 DIAGNOSIS — R87619 Unspecified abnormal cytological findings in specimens from cervix uteri: Secondary | ICD-10-CM

## 2023-03-04 DIAGNOSIS — Z720 Tobacco use: Secondary | ICD-10-CM | POA: Diagnosis not present

## 2023-03-04 DIAGNOSIS — M2392 Unspecified internal derangement of left knee: Secondary | ICD-10-CM | POA: Diagnosis not present

## 2023-03-04 DIAGNOSIS — Z7689 Persons encountering health services in other specified circumstances: Secondary | ICD-10-CM | POA: Insufficient documentation

## 2023-03-04 NOTE — Assessment & Plan Note (Signed)
Currently vaping minimally, counseling provided.

## 2023-03-04 NOTE — Patient Instructions (Signed)
-   Maintain follow-up with [facility] (bring left knee MRI disc) - Continue hinged knee brace - Continue current medication regimen - Return for follow-up in 1 month for annual physical

## 2023-03-04 NOTE — Assessment & Plan Note (Signed)
Is currently established with local gynecologist, recommended routine follow-up with their group.  Will update records once available.
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
## 2023-03-04 NOTE — Assessment & Plan Note (Signed)
Patient presents for establishment for primary care.  Past medical, surgical, and family history reviewed.  Discussed care gaps and we will coordinate follow-up for annual physical where risk stratification labs are to be obtained.
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
## 2023-03-08 DIAGNOSIS — M4856XA Collapsed vertebra, not elsewhere classified, lumbar region, initial encounter for fracture: Secondary | ICD-10-CM | POA: Diagnosis not present

## 2023-03-08 DIAGNOSIS — M5126 Other intervertebral disc displacement, lumbar region: Secondary | ICD-10-CM | POA: Diagnosis not present

## 2023-03-14 DIAGNOSIS — M4856XA Collapsed vertebra, not elsewhere classified, lumbar region, initial encounter for fracture: Secondary | ICD-10-CM | POA: Diagnosis not present

## 2023-03-14 DIAGNOSIS — M5416 Radiculopathy, lumbar region: Secondary | ICD-10-CM | POA: Diagnosis not present

## 2023-03-29 ENCOUNTER — Other Ambulatory Visit: Payer: Self-pay

## 2023-03-29 ENCOUNTER — Telehealth: Payer: Self-pay | Admitting: Family Medicine

## 2023-03-29 DIAGNOSIS — M2392 Unspecified internal derangement of left knee: Secondary | ICD-10-CM

## 2023-03-29 NOTE — Telephone Encounter (Signed)
Referral placed.

## 2023-03-29 NOTE — Telephone Encounter (Signed)
Copied from CRM [id number]. Topic: Referral - Request for Referral >> Mar 29, 2023 12:36 PM Marlow Baars wrote: Has patient seen PCP for this complaint? Yes.   Referral for which specialty: Orthopedics Preferred provider/office: [facility] Reason for referral: Torn ACL, MCL left knee per recetn MRI  Please send referral to attention of Dr Rosita Kea at [facility] in [place] as soon as possible. Please send referral to fax number [phone]

## 2023-04-01 DIAGNOSIS — M533 Sacrococcygeal disorders, not elsewhere classified: Secondary | ICD-10-CM | POA: Diagnosis not present

## 2023-04-04 DIAGNOSIS — S83512A Sprain of anterior cruciate ligament of left knee, initial encounter: Secondary | ICD-10-CM | POA: Diagnosis not present

## 2023-04-25 DIAGNOSIS — M533 Sacrococcygeal disorders, not elsewhere classified: Secondary | ICD-10-CM | POA: Diagnosis not present

## 2023-04-25 DIAGNOSIS — M4856XA Collapsed vertebra, not elsewhere classified, lumbar region, initial encounter for fracture: Secondary | ICD-10-CM | POA: Diagnosis not present

## 2023-04-26 ENCOUNTER — Encounter: Payer: Medicaid Other | Admitting: Family Medicine

## 2023-04-28 DIAGNOSIS — M25662 Stiffness of left knee, not elsewhere classified: Secondary | ICD-10-CM | POA: Diagnosis not present

## 2023-04-28 DIAGNOSIS — M25562 Pain in left knee: Secondary | ICD-10-CM | POA: Diagnosis not present

## 2023-05-09 DIAGNOSIS — S83512D Sprain of anterior cruciate ligament of left knee, subsequent encounter: Secondary | ICD-10-CM | POA: Diagnosis not present

## 2023-05-10 DIAGNOSIS — M533 Sacrococcygeal disorders, not elsewhere classified: Secondary | ICD-10-CM | POA: Diagnosis not present

## 2023-05-11 ENCOUNTER — Encounter: Payer: Self-pay | Admitting: Family Medicine

## 2023-05-11 ENCOUNTER — Ambulatory Visit (INDEPENDENT_AMBULATORY_CARE_PROVIDER_SITE_OTHER): Payer: Medicaid Other | Admitting: Family Medicine

## 2023-05-11 VITALS — BP 120/76 | HR 68 | Ht 67.0 in | Wt 213.0 lb

## 2023-05-11 DIAGNOSIS — F419 Anxiety disorder, unspecified: Secondary | ICD-10-CM

## 2023-05-11 DIAGNOSIS — Z114 Encounter for screening for human immunodeficiency virus [HIV]: Secondary | ICD-10-CM | POA: Diagnosis not present

## 2023-05-11 DIAGNOSIS — E559 Vitamin D deficiency, unspecified: Secondary | ICD-10-CM

## 2023-05-11 DIAGNOSIS — F909 Attention-deficit hyperactivity disorder, unspecified type: Secondary | ICD-10-CM

## 2023-05-11 DIAGNOSIS — Z23 Encounter for immunization: Secondary | ICD-10-CM

## 2023-05-11 DIAGNOSIS — M2392 Unspecified internal derangement of left knee: Secondary | ICD-10-CM | POA: Diagnosis not present

## 2023-05-11 DIAGNOSIS — Z13220 Encounter for screening for lipoid disorders: Secondary | ICD-10-CM | POA: Diagnosis not present

## 2023-05-11 DIAGNOSIS — Z1159 Encounter for screening for other viral diseases: Secondary | ICD-10-CM

## 2023-05-11 DIAGNOSIS — R7309 Other abnormal glucose: Secondary | ICD-10-CM | POA: Diagnosis not present

## 2023-05-11 DIAGNOSIS — Z720 Tobacco use: Secondary | ICD-10-CM

## 2023-05-11 DIAGNOSIS — Z0000 Encounter for general adult medical examination without abnormal findings: Secondary | ICD-10-CM | POA: Insufficient documentation

## 2023-05-11 DIAGNOSIS — R87619 Unspecified abnormal cytological findings in specimens from cervix uteri: Secondary | ICD-10-CM

## 2023-05-11 NOTE — Patient Instructions (Signed)
-   Obtain fasting labs with orders provided (can have water or black coffee but otherwise no food or drink x 8 hours before labs) - Review information provided - Attend eye doctor annually, dentist every 6 months, work towards or maintain 30 minutes of moderate intensity physical activity at least 5 days per week, and consume a balanced diet - Return in 1 year for physical - Contact us for any questions between now and then  Additionally: - Maintain follow-up with [facility] - Maintain follow-up with gynecology, have records forwarded to our office

## 2023-05-11 NOTE — Progress Notes (Signed)
Annual Physical Exam Visit  Patient Information:  Patient ID: Katherine Johnston, female DOB: 1981-09-15 Age: 42 y.o. MRN: [id number]   Subjective:   CC: Annual Physical Exam  HPI:  Katherine Johnston is here for their annual physical.  I reviewed the past medical history, family history, social history, surgical history, and allergies today and changes were made as necessary.  Please see the problem list section below for additional details.  Past Medical History: Past Medical History:  Diagnosis Date   ADD (attention deficit disorder) 2008   Dx via [facility] Pys[facility]   Past Surgical History: Past Surgical History:  Procedure Laterality Date   MANDIBLE SURGERY  2023   Surgery following complication from wisdom tooth extraction   WISDOM TOOTH EXTRACTION Bilateral 1997   Family History: Family History  Problem Relation Age of Onset   Heart disease Mother    Kidney disease Mother    Endometrial cancer Mother 55   Diabetes Father 98 - 10   Coronary artery disease Father    Heart disease Father    Hyperlipidemia Father    Hypertension Father    Diabetes Brother    ADD / ADHD Brother    Sleep apnea Brother    Heart disease Paternal Grandfather    Allergies: Allergies  Allergen Reactions   Penicillins Hives   Penicillin V Hives   Health Maintenance: Health Maintenance  Topic Date Due   COVID-19 Vaccine (1) Never done   DTaP/Tdap/Td (1 - Tdap) Never done   HPV VACCINES (3 - 3-dose series) 01/14/2007   PAP SMEAR-Modifier  06/11/2022   INFLUENZA VACCINE  07/07/2023   Hepatitis C Screening  Completed   HIV Screening  Completed    HM Colonoscopy     This patient has no relevant Health Maintenance data.      Medications: Current Outpatient Medications on File Prior to Visit  Medication Sig Dispense Refill   Baclofen 5 MG TABS 1-2 po 1 hr before bedtime     LYRICA 25 MG capsule 1 -2 po with dinner     naproxen (NAPROSYN) 500 MG tablet Take 1 tablet twice  a day by oral route with meal(s).     No current facility-administered medications on file prior to visit.    Review of Systems: No headache, visual changes, nausea, vomiting, diarrhea, constipation, dizziness, abdominal pain, skin rash, fevers, chills, night sweats, swollen lymph nodes, weight loss, chest pain, body aches, joint swelling, +muscle aches, shortness of breath, mood changes, visual or auditory hallucinations reported.  Objective:   Vitals:   05/11/23 1040  BP: 120/76  Pulse: 68  SpO2: 99%   Vitals:   05/11/23 1040  Weight: 213 lb (96.6 kg)  Height: 5\' 7"  (1.702 m)   Body mass index is 33.36 kg/m.  General: Well Developed, well nourished, and in no acute distress.  Neuro: Alert and oriented x3, extra-ocular muscles intact, sensation grossly intact. Cranial nerves II through XII are grossly intact, motor, sensory, and coordinative functions are intact. HEENT: Normocephalic, atraumatic, pupils equal round reactive to light, neck supple, no masses, no lymphadenopathy, thyroid nonpalpable. Oropharynx, nasopharynx, external ear canals are unremarkable. Skin: Warm and dry, no rashes noted.  Cardiac: Regular rate and rhythm, no murmurs rubs or gallops. No peripheral edema. Pulses symmetric. Respiratory: Clear to auscultation bilaterally. Not using accessory muscles, speaking in full sentences.  Abdominal: Soft, nontender, nondistended, positive bowel sounds, no masses, no organomegaly. Musculoskeletal: Shoulder, elbow, wrist, hip, knee, ankle stable, and  with full range of motion.  Female chaperone initials: AS present throughout the physical examination.  Impression and Recommendations:   The patient was counselled, risk factors were discussed, and anticipatory guidance given.  Problem List Items Addressed This Visit       Musculoskeletal and Integument   Internal derangement of left knee    Upcoming planned surgery for left knee with [facility], will follow  peripherally on this        Other   Tobacco use   Healthcare maintenance - Primary    Annual examination completed, risk stratification labs ordered, anticipatory guidance provided.  We will follow labs once resulted.      Relevant Orders   CBC   Comprehensive metabolic panel   Hepatitis C antibody   HIV Antibody (routine testing w rflx)   Lipid panel   TSH   VITAMIN D 25 Hydroxy (Vit-D Deficiency, Fractures)   Anxiety disorder dx'd age 60   ADHD--sees psych for meds   Abnormal Pap smear of cervix    Plan to follow-up for repeat pap this year      Other Visit Diagnoses     Vitamin D deficiency       Relevant Orders   VITAMIN D 25 Hydroxy (Vit-D Deficiency, Fractures)   Annual physical exam       Relevant Orders   CBC   Comprehensive metabolic panel   Hepatitis C antibody   HIV Antibody (routine testing w rflx)   Lipid panel   TSH   VITAMIN D 25 Hydroxy (Vit-D Deficiency, Fractures)   Screening for HIV (human immunodeficiency virus)       Relevant Orders   HIV Antibody (routine testing w rflx)   Need for hepatitis C screening test       Relevant Orders   Hepatitis C antibody   Screening for lipoid disorders       Relevant Orders   Comprehensive metabolic panel   Lipid panel   Need for diphtheria-tetanus-pertussis (Tdap) vaccine       Relevant Orders   Tdap vaccine greater than or equal to 7yo IM        Orders & Medications Medications: No orders of the defined types were placed in this encounter.  Orders Placed This Encounter  Procedures   Tdap vaccine greater than or equal to 7yo IM   CBC   Comprehensive metabolic panel   Hepatitis C antibody   HIV Antibody (routine testing w rflx)   Lipid panel   TSH   VITAMIN D 25 Hydroxy (Vit-D Deficiency, Fractures)     No follow-ups on file.    Jerrol Banana, MD, [facility]   Primary Care Sports Medicine Primary Care and Sports Medicine at [facility]

## 2023-05-11 NOTE — Assessment & Plan Note (Signed)
Plan to follow-up for repeat pap this year

## 2023-05-11 NOTE — Assessment & Plan Note (Signed)
Annual examination completed, risk stratification labs ordered, anticipatory guidance provided.  We will follow labs once resulted. 

## 2023-05-11 NOTE — Assessment & Plan Note (Signed)
Upcoming planned surgery for left knee with [facility], will follow peripherally on this

## 2023-05-11 NOTE — Addendum Note (Signed)
Addended by: Jerrol Banana on: 05/11/2023 11:45 AM   Modules accepted: Level of Service

## 2023-05-12 ENCOUNTER — Other Ambulatory Visit: Payer: Self-pay | Admitting: Family Medicine

## 2023-05-12 ENCOUNTER — Encounter: Payer: Self-pay | Admitting: Family Medicine

## 2023-05-12 DIAGNOSIS — E559 Vitamin D deficiency, unspecified: Secondary | ICD-10-CM

## 2023-05-12 LAB — COMPREHENSIVE METABOLIC PANEL
ALT: 18 IU/L (ref 0–32)
AST: 13 IU/L (ref 0–40)
Albumin/Globulin Ratio: 1.8 (ref 1.2–2.2)
Albumin: 4.4 g/dL (ref 3.9–4.9)
Alkaline Phosphatase: 81 IU/L (ref 44–121)
BUN/Creatinine Ratio: 14 (ref 9–23)
BUN: 12 mg/dL (ref 6–24)
Bilirubin Total: 0.6 mg/dL (ref 0.0–1.2)
CO2: 20 mmol/L (ref 20–29)
Calcium: 9.1 mg/dL (ref 8.7–10.2)
Chloride: 106 mmol/L (ref 96–106)
Creatinine, Ser: 0.88 mg/dL (ref 0.57–1.00)
Globulin, Total: 2.4 g/dL (ref 1.5–4.5)
Glucose: 103 mg/dL — ABNORMAL HIGH (ref 70–99)
Potassium: 4.1 mmol/L (ref 3.5–5.2)
Sodium: 140 mmol/L (ref 134–144)
Total Protein: 6.8 g/dL (ref 6.0–8.5)
eGFR: 84 mL/min/{1.73_m2} (ref >59)

## 2023-05-12 LAB — CBC
Hematocrit: 40.7 % (ref 34.0–46.6)
Hemoglobin: 14 g/dL (ref 11.1–15.9)
MCH: 30.9 pg (ref 26.6–33.0)
MCHC: 34.4 g/dL (ref 31.5–35.7)
MCV: 90 fL (ref 79–97)
Platelets: 253 10*3/uL (ref 150–450)
RBC: 4.53 x10E6/uL (ref 3.77–5.28)
RDW: 12.8 % (ref 11.7–15.4)
WBC: 6.2 10*3/uL (ref 3.4–10.8)

## 2023-05-12 LAB — LIPID PANEL
Chol/HDL Ratio: 4.5 ratio — ABNORMAL HIGH (ref 0.0–4.4)
Cholesterol, Total: 235 mg/dL — ABNORMAL HIGH (ref 100–199)
HDL: 52 mg/dL (ref >39)
LDL Chol Calc (NIH): 165 mg/dL — ABNORMAL HIGH (ref 0–99)
Triglycerides: 99 mg/dL (ref 0–149)
VLDL Cholesterol Cal: 18 mg/dL (ref 5–40)

## 2023-05-12 LAB — HEPATITIS C ANTIBODY: Hep C Virus Ab: NONREACTIVE

## 2023-05-12 LAB — HIV ANTIBODY (ROUTINE TESTING W REFLEX): HIV Screen 4th Generation wRfx: NONREACTIVE

## 2023-05-12 LAB — VITAMIN D 25 HYDROXY (VIT D DEFICIENCY, FRACTURES): Vit D, 25-Hydroxy: 20.9 ng/mL — ABNORMAL LOW (ref 30.0–100.0)

## 2023-05-12 LAB — TSH: TSH: 1.59 u[IU]/mL (ref 0.450–4.500)

## 2023-05-12 MED ORDER — VITAMIN D (ERGOCALCIFEROL) 1.25 MG (50000 UNIT) PO CAPS: 50000.0000 [IU] | ORAL_CAPSULE | ORAL | 0 refills | Status: DC

## 2023-05-12 NOTE — Progress Notes (Signed)
A1C added R73.09.  KP 

## 2023-05-16 DIAGNOSIS — M533 Sacrococcygeal disorders, not elsewhere classified: Secondary | ICD-10-CM | POA: Diagnosis not present

## 2023-05-16 LAB — HGB A1C W/O EAG: Hgb A1c MFr Bld: 5.3 % (ref 4.8–5.6)

## 2023-05-16 LAB — SPECIMEN STATUS REPORT

## 2023-05-18 DIAGNOSIS — M533 Sacrococcygeal disorders, not elsewhere classified: Secondary | ICD-10-CM | POA: Diagnosis not present

## 2023-05-25 DIAGNOSIS — M533 Sacrococcygeal disorders, not elsewhere classified: Secondary | ICD-10-CM | POA: Diagnosis not present

## 2023-05-30 DIAGNOSIS — M25662 Stiffness of left knee, not elsewhere classified: Secondary | ICD-10-CM | POA: Diagnosis not present

## 2023-05-30 DIAGNOSIS — M25562 Pain in left knee: Secondary | ICD-10-CM | POA: Diagnosis not present

## 2023-05-31 DIAGNOSIS — M533 Sacrococcygeal disorders, not elsewhere classified: Secondary | ICD-10-CM | POA: Diagnosis not present

## 2023-06-02 DIAGNOSIS — M533 Sacrococcygeal disorders, not elsewhere classified: Secondary | ICD-10-CM | POA: Diagnosis not present

## 2023-06-07 DIAGNOSIS — M533 Sacrococcygeal disorders, not elsewhere classified: Secondary | ICD-10-CM | POA: Diagnosis not present

## 2023-06-14 DIAGNOSIS — S83512A Sprain of anterior cruciate ligament of left knee, initial encounter: Secondary | ICD-10-CM | POA: Diagnosis not present

## 2023-06-14 DIAGNOSIS — G8918 Other acute postprocedural pain: Secondary | ICD-10-CM | POA: Diagnosis not present

## 2023-06-16 DIAGNOSIS — M25662 Stiffness of left knee, not elsewhere classified: Secondary | ICD-10-CM | POA: Diagnosis not present

## 2023-06-16 DIAGNOSIS — M25562 Pain in left knee: Secondary | ICD-10-CM | POA: Diagnosis not present

## 2023-06-23 DIAGNOSIS — M25662 Stiffness of left knee, not elsewhere classified: Secondary | ICD-10-CM | POA: Diagnosis not present

## 2023-06-23 DIAGNOSIS — M25562 Pain in left knee: Secondary | ICD-10-CM | POA: Diagnosis not present

## 2023-06-28 DIAGNOSIS — Z4889 Encounter for other specified surgical aftercare: Secondary | ICD-10-CM | POA: Diagnosis not present

## 2023-06-29 DIAGNOSIS — M25662 Stiffness of left knee, not elsewhere classified: Secondary | ICD-10-CM | POA: Diagnosis not present

## 2023-06-29 DIAGNOSIS — M25562 Pain in left knee: Secondary | ICD-10-CM | POA: Diagnosis not present

## 2023-07-05 DIAGNOSIS — M25562 Pain in left knee: Secondary | ICD-10-CM | POA: Diagnosis not present

## 2023-07-05 DIAGNOSIS — M25662 Stiffness of left knee, not elsewhere classified: Secondary | ICD-10-CM | POA: Diagnosis not present

## 2023-07-21 DIAGNOSIS — M25662 Stiffness of left knee, not elsewhere classified: Secondary | ICD-10-CM | POA: Diagnosis not present

## 2023-07-21 DIAGNOSIS — M25562 Pain in left knee: Secondary | ICD-10-CM | POA: Diagnosis not present

## 2023-07-28 DIAGNOSIS — M25562 Pain in left knee: Secondary | ICD-10-CM | POA: Diagnosis not present

## 2023-07-28 DIAGNOSIS — M25662 Stiffness of left knee, not elsewhere classified: Secondary | ICD-10-CM | POA: Diagnosis not present

## 2023-08-04 DIAGNOSIS — M25662 Stiffness of left knee, not elsewhere classified: Secondary | ICD-10-CM | POA: Diagnosis not present

## 2023-08-04 DIAGNOSIS — M25562 Pain in left knee: Secondary | ICD-10-CM | POA: Diagnosis not present

## 2023-08-10 DIAGNOSIS — M25562 Pain in left knee: Secondary | ICD-10-CM | POA: Diagnosis not present

## 2023-08-10 DIAGNOSIS — M25662 Stiffness of left knee, not elsewhere classified: Secondary | ICD-10-CM | POA: Diagnosis not present

## 2023-08-26 DIAGNOSIS — M25662 Stiffness of left knee, not elsewhere classified: Secondary | ICD-10-CM | POA: Diagnosis not present

## 2023-08-26 DIAGNOSIS — M25562 Pain in left knee: Secondary | ICD-10-CM | POA: Diagnosis not present

## 2023-08-30 ENCOUNTER — Ambulatory Visit (INDEPENDENT_AMBULATORY_CARE_PROVIDER_SITE_OTHER): Payer: Medicaid Other | Admitting: Advanced Practice Midwife

## 2023-08-30 ENCOUNTER — Other Ambulatory Visit (HOSPITAL_COMMUNITY)
Admission: RE | Admit: 2023-08-30 | Discharge: 2023-08-30 | Disposition: A | Discharge status: Home / Self Care | Payer: Medicaid Other | Source: Ambulatory Visit | Attending: Advanced Practice Midwife | Admitting: Advanced Practice Midwife

## 2023-08-30 ENCOUNTER — Encounter: Payer: Self-pay | Admitting: Advanced Practice Midwife

## 2023-08-30 VITALS — BP 114/64 | HR 70 | Ht 67.0 in | Wt 215.6 lb

## 2023-08-30 DIAGNOSIS — Z01419 Encounter for gynecological examination (general) (routine) without abnormal findings: Secondary | ICD-10-CM | POA: Diagnosis not present

## 2023-08-30 DIAGNOSIS — Z124 Encounter for screening for malignant neoplasm of cervix: Secondary | ICD-10-CM | POA: Insufficient documentation

## 2023-08-30 DIAGNOSIS — Z1159 Encounter for screening for other viral diseases: Secondary | ICD-10-CM

## 2023-08-30 DIAGNOSIS — Z113 Encounter for screening for infections with a predominantly sexual mode of transmission: Secondary | ICD-10-CM | POA: Insufficient documentation

## 2023-08-30 DIAGNOSIS — Z13220 Encounter for screening for lipoid disorders: Secondary | ICD-10-CM

## 2023-08-30 DIAGNOSIS — Z8639 Personal history of other endocrine, nutritional and metabolic disease: Secondary | ICD-10-CM

## 2023-08-30 DIAGNOSIS — Z3041 Encounter for surveillance of contraceptive pills: Secondary | ICD-10-CM

## 2023-08-30 MED ORDER — NORGESTIMATE-ETH ESTRADIOL 0.25-35 MG-MCG PO TABS: 1.0000 | ORAL_TABLET | Freq: Every day | ORAL | 11 refills | Status: DC

## 2023-08-30 NOTE — Patient Instructions (Addendum)
Managing Hot Flashes During Menopause You will learn what hot flashes/night sweats are, what causes them and what the treatment methods are. To view the content, go to this web address: https://pe.elsevier.[email or web address]  This video will expire on: 05/29/2025. If you need access to this video following this date, please reach out to the healthcare provider who assigned it to you. This information is not intended to replace advice given to you by your health care provider. Make sure you discuss any questions you have with your health care provider. Elsevier Patient Education  2024 [employer].
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
## 2023-08-31 LAB — LIPID PANEL WITH LDL/HDL RATIO
Cholesterol, Total: 205 mg/dL — ABNORMAL HIGH (ref 100–199)
HDL: 51 mg/dL (ref >39)
LDL Chol Calc (NIH): 134 mg/dL — ABNORMAL HIGH (ref 0–99)
LDL/HDL Ratio: 2.6 ratio (ref 0.0–3.2)
Triglycerides: 113 mg/dL (ref 0–149)
VLDL Cholesterol Cal: 20 mg/dL (ref 5–40)

## 2023-08-31 LAB — VITAMIN D 25 HYDROXY (VIT D DEFICIENCY, FRACTURES): Vit D, 25-Hydroxy: 29.7 ng/mL — ABNORMAL LOW (ref 30.0–100.0)

## 2023-08-31 LAB — RPR QUALITATIVE: RPR Ser Ql: NONREACTIVE

## 2023-08-31 LAB — HIV ANTIBODY (ROUTINE TESTING W REFLEX): HIV Screen 4th Generation wRfx: NONREACTIVE

## 2023-08-31 LAB — HEPATITIS C ANTIBODY: Hep C Virus Ab: NONREACTIVE

## 2023-08-31 LAB — HEPATITIS B SURFACE ANTIBODY,QUALITATIVE: Hep B Surface Ab, Qual: NONREACTIVE

## 2023-09-02 DIAGNOSIS — M25562 Pain in left knee: Secondary | ICD-10-CM | POA: Diagnosis not present

## 2023-09-02 DIAGNOSIS — M25662 Stiffness of left knee, not elsewhere classified: Secondary | ICD-10-CM | POA: Diagnosis not present

## 2023-09-05 LAB — CYTOLOGY - PAP
Chlamydia: NEGATIVE
Comment: NEGATIVE
Comment: NEGATIVE
Comment: NORMAL
Diagnosis: NEGATIVE
Neisseria Gonorrhea: NEGATIVE
Trichomonas: NEGATIVE

## 2023-09-09 DIAGNOSIS — M25562 Pain in left knee: Secondary | ICD-10-CM | POA: Diagnosis not present

## 2023-09-09 DIAGNOSIS — M25662 Stiffness of left knee, not elsewhere classified: Secondary | ICD-10-CM | POA: Diagnosis not present

## 2023-09-15 DIAGNOSIS — H5213 Myopia, bilateral: Secondary | ICD-10-CM | POA: Diagnosis not present

## 2023-09-16 DIAGNOSIS — M25662 Stiffness of left knee, not elsewhere classified: Secondary | ICD-10-CM | POA: Diagnosis not present

## 2023-09-16 DIAGNOSIS — M25562 Pain in left knee: Secondary | ICD-10-CM | POA: Diagnosis not present

## 2023-10-20 DIAGNOSIS — M25562 Pain in left knee: Secondary | ICD-10-CM | POA: Diagnosis not present

## 2023-10-20 DIAGNOSIS — M25662 Stiffness of left knee, not elsewhere classified: Secondary | ICD-10-CM | POA: Diagnosis not present

## 2023-11-07 DIAGNOSIS — M25662 Stiffness of left knee, not elsewhere classified: Secondary | ICD-10-CM | POA: Diagnosis not present

## 2023-11-17 DIAGNOSIS — M25662 Stiffness of left knee, not elsewhere classified: Secondary | ICD-10-CM | POA: Diagnosis not present

## 2023-11-17 DIAGNOSIS — M25562 Pain in left knee: Secondary | ICD-10-CM | POA: Diagnosis not present

## 2024-03-29 ENCOUNTER — Other Ambulatory Visit: Payer: Self-pay

## 2024-03-29 ENCOUNTER — Encounter: Payer: Self-pay | Admitting: Advanced Practice Midwife

## 2024-03-29 DIAGNOSIS — Z01419 Encounter for gynecological examination (general) (routine) without abnormal findings: Secondary | ICD-10-CM

## 2024-03-29 DIAGNOSIS — Z3041 Encounter for surveillance of contraceptive pills: Secondary | ICD-10-CM

## 2024-03-29 MED ORDER — NORGESTIMATE-ETH ESTRADIOL 0.25-35 MG-MCG PO TABS: 1.0000 | ORAL_TABLET | Freq: Every day | ORAL | 5 refills | Status: DC

## 2024-03-30 ENCOUNTER — Other Ambulatory Visit: Payer: Self-pay

## 2024-03-30 DIAGNOSIS — Z3041 Encounter for surveillance of contraceptive pills: Secondary | ICD-10-CM

## 2024-03-30 DIAGNOSIS — Z01419 Encounter for gynecological examination (general) (routine) without abnormal findings: Secondary | ICD-10-CM

## 2024-03-30 MED ORDER — NORGESTIMATE-ETH ESTRADIOL 0.25-35 MG-MCG PO TABS: ORAL_TABLET | ORAL | 5 refills | Status: DC

## 2024-05-17 ENCOUNTER — Encounter: Payer: Self-pay | Admitting: Family Medicine

## 2024-06-12 ENCOUNTER — Encounter: Payer: Self-pay | Admitting: Family Medicine

## 2024-06-12 ENCOUNTER — Ambulatory Visit (INDEPENDENT_AMBULATORY_CARE_PROVIDER_SITE_OTHER): Admitting: Family Medicine

## 2024-06-12 VITALS — BP 108/72 | HR 66 | Ht 67.0 in | Wt 200.8 lb

## 2024-06-12 DIAGNOSIS — J309 Allergic rhinitis, unspecified: Secondary | ICD-10-CM

## 2024-06-12 DIAGNOSIS — B354 Tinea corporis: Secondary | ICD-10-CM | POA: Insufficient documentation

## 2024-06-12 DIAGNOSIS — M5417 Radiculopathy, lumbosacral region: Secondary | ICD-10-CM | POA: Insufficient documentation

## 2024-06-12 DIAGNOSIS — M62838 Other muscle spasm: Secondary | ICD-10-CM | POA: Diagnosis not present

## 2024-06-12 MED ORDER — GABAPENTIN 100 MG PO CAPS: 100.0000 mg | ORAL_CAPSULE | Freq: Every evening | ORAL | 0 refills | Status: DC | PRN

## 2024-06-12 MED ORDER — BACLOFEN 5 MG PO TABS: 1.0000 | ORAL_TABLET | Freq: Three times a day (TID) | ORAL | 0 refills | Status: DC | PRN

## 2024-06-12 MED ORDER — KETOCONAZOLE 2 % EX CREA: 1.0000 | TOPICAL_CREAM | Freq: Every day | CUTANEOUS | 0 refills | Status: DC

## 2024-06-12 MED ORDER — MELOXICAM 15 MG PO TABS: 15.0000 mg | ORAL_TABLET | Freq: Every day | ORAL | 0 refills | Status: DC

## 2024-06-12 NOTE — Patient Instructions (Addendum)
 Patient Plan for Post-Visit Guidance  1. Allergic Rhinitis with Postnasal Drip:    - Use Flonase  nasal spray daily for 7 days, then as needed.    - Continue antihistamines as needed.    - Add Mucinex  to thin mucus.    - Use nasal saline irrigation regularly.    - Reassess if symptoms worsen or persist; contact the office after 7 days if needed.  2. Cervical Paraspinal Muscle Spasm:    - Take baclofen  1-2 tablets three times daily as needed for muscle tightness. Be aware of possible drowsiness.    - Start home-based physical therapy focusing on cervical stabilization (see MyChart for details).    - Massage therapy is okay for upper back and neck.    - Contact us  if symptoms persist despite treatment.  3. Right-Sided Sciatica:    - Take meloxicam  15 mg daily with food for one week, then as needed.    - Use baclofen  for muscle spasm, 1-2 tablets three times daily as needed.    - Consider gabapentin  at night if symptoms progress and it is tolerated.    - Continue SI joint and core strengthening exercises (see MyChart for details).    - Consider an SI joint injection if symptoms do not improve with treatment; discuss at follow-up.  4. L1 Vertebral Compression Fracture:    - Focus on lumbar stabilization and core strengthening.    - Avoid high-impact or aggravating activities.  5. Tinea Corporis:    - Apply ketoconazole  2% cream once daily for 1-3 weeks until resolved.  Red Flags: - If you experience increased pain, new symptoms, or severe side effects from medications, please contact the office for further guidance.

## 2024-06-12 NOTE — Assessment & Plan Note (Signed)
 Sinonasal and ocular symptoms Nasal congestion and throat irritation for approximately 1.5 months, with mild acute worsening over the past 2 days Right-sided head discomfort attributed to cervical muscle spasm at the right levator scapula insertion; worsened by wearing glasses Mild sore throat likely due to postnasal drip No purulent nasal discharge, facial pressure, or maxillary tenderness Postnasal drip present, likely secondary to allergic rhinitis Brief eye irritation following exposure to daughter's conjunctivitis, resolved within 24 hours with antibiotic drops No significant ear symptoms; transient irritation managed with over-the-counter ear drops Current medications include Tylenol , ibuprofen , Zyrtec, and nasal sprays  Physical Exam Palpation: Tenderness at insertion of right levator scapula, right greater trochanter, right SI joint, and mid-inferior aspect of right gluteus medius Nontender in right gluteus medius/minimus, TFL, and piriformis regions Special tests: Positive straight leg raise on right, negative piriformis test, equivocal FADIR, positive TENS test localizing to right HEENT: Right tympanic membrane normal, no fluid; mild turbinate erythema, right greater than left, no swelling; oral pharynx clear Neck: Tender lymphadenopathy in left submandibular region  Allergic rhinitis with postnasal drip No evidence of acute bacterial infection Continue / restart Flonase  nasal spray daily x 7 days then daily PRN Continue / restart antihistamines as needed Add Mucinex  for mucus thinning Use nasal saline irrigation regularly Reassess if symptoms worsen or persist, contact office for next steps after 7 days

## 2024-06-12 NOTE — Assessment & Plan Note (Signed)
 See additional assessment(s) for plan details.  Cervical paraspinal muscular spasm Right-sided head pain due to focal spasm at levator scapula origin Start baclofen  for muscle tightness. 1-2 tablets TID PRN. Side effect can be drowsiness Start home-based physical therapy with emphasis on cervical stabilization (see separate MyChart message) Massage therapy okay as adjunct for upper back and cervical musculature Contact us  for any persistent symptoms despite adherence to the above

## 2024-06-12 NOTE — Assessment & Plan Note (Signed)
 Chronic right-sided sciatica Likely secondary to L1 compression fracture and SI joint dysfunction Positive straight leg raise and radicular symptoms support nerve involvement Continue meloxicam  15 mg daily with food Baclofen  for muscle spasm Consider re-initiation of gabapentin  if symptoms progress and tolerated Continue SI joint and core strengthening exercises Consider SI joint injection if not responsive to conservative treatment L1 vertebral compression fracture Stable, nondisplaced Emphasis on lumbar stabilization and core strengthening Avoid high-impact or aggravating activities  Physical Exam Palpation: Tenderness at origin of right levator scapula, right greater trochanter, right SI joint, and mid-inferior aspect of right gluteus medius Nontender in right gluteus medius/minimus otherwise, TFL, and piriformis Special tests: Positive straight leg raise on right, negative piriformis test, equivocal FADIR, positive TENS test localizing to right  Right-sided sciatica Past history L1 compression fracture and SI joint dysfunction most likely contributory Positive straight leg raise and radicular symptoms support nerve involvement Start meloxicam  15 mg daily with food x 1 week then daily as needed Baclofen  for muscle spasm, 1-2 tablets 3 times daily as needed. Consider re-initiation of gabapentin  if symptoms progress and tolerated, dose nightly on an as-needed basis Continue SI joint and core strengthening exercises, see separate MyChart message with exercises Consider SI joint injection if not responsive to conservative treatment, can consider at follow-up L1 vertebral compression fracture Stable Emphasis on lumbar stabilization and core strengthening Avoid high-impact or aggravating activities

## 2024-06-12 NOTE — Progress Notes (Signed)
 Primary Care / Sports Medicine Office Visit  Patient Information:  Patient ID: Katherine Johnston, female DOB: 09/06/1981 Age: 43 y.o. MRN: [id number]   Katherine Johnston is a pleasant 43 y.o. female presenting with the following:  Chief Complaint  Patient presents with   Sore Throat    Patient presents today for sore throat x 2 days. She is not having any coughing but sore with swallowing and right side of face feels full.     Vitals:   06/12/24 1052  BP: 108/72  Pulse: 66  SpO2: 96%   Vitals:   06/12/24 1052  Weight: 200 lb 12.8 oz (91.1 kg)  Height: 5' 7 (1.702 m)   Body mass index is 31.45 kg/m.  No results found.   Independent interpretation of notes and tests performed by another provider:   None  Procedures performed:   None  Pertinent History, Exam, Impression, and Recommendations:   Problem List Items Addressed This Visit     Allergic rhinitis   Sinonasal and ocular symptoms Nasal congestion and throat irritation for approximately 1.5 months, with mild acute worsening over the past 2 days Right-sided head discomfort attributed to cervical muscle spasm at the right levator scapula insertion; worsened by wearing glasses Mild sore throat likely due to postnasal drip No purulent nasal discharge, facial pressure, or maxillary tenderness Postnasal drip present, likely secondary to allergic rhinitis Brief eye irritation following exposure to daughter's conjunctivitis, resolved within 24 hours with antibiotic drops No significant ear symptoms; transient irritation managed with over-the-counter ear drops Current medications include Tylenol , ibuprofen , Zyrtec, and nasal sprays  Physical Exam Palpation: Tenderness at insertion of right levator scapula, right greater trochanter, right SI joint, and mid-inferior aspect of right gluteus medius Nontender in right gluteus medius/minimus, TFL, and piriformis regions Special tests: Positive straight leg raise on right,  negative piriformis test, equivocal FADIR, positive TENS test localizing to right HEENT: Right tympanic membrane normal, no fluid; mild turbinate erythema, right greater than left, no swelling; oral pharynx clear Neck: Tender lymphadenopathy in left submandibular region  Allergic rhinitis with postnasal drip No evidence of acute bacterial infection Continue / restart Flonase  nasal spray daily x 7 days then daily PRN Continue / restart antihistamines as needed Add Mucinex  for mucus thinning Use nasal saline irrigation regularly Reassess if symptoms worsen or persist, contact office for next steps after 7 days      Cervical paraspinal muscle spasm   See additional assessment(s) for plan details.  Cervical paraspinal muscular spasm Right-sided head pain due to focal spasm at levator scapula origin Start baclofen  for muscle tightness. 1-2 tablets TID PRN. Side effect can be drowsiness Start home-based physical therapy with emphasis on cervical stabilization (see separate MyChart message) Massage therapy okay as adjunct for upper back and cervical musculature Contact us  for any persistent symptoms despite adherence to the above      Relevant Medications   Baclofen  5 MG TABS   Right lumbosacral radiculopathy - Primary   Chronic right-sided sciatica Likely secondary to L1 compression fracture and SI joint dysfunction Positive straight leg raise and radicular symptoms support nerve involvement Continue meloxicam  15 mg daily with food Baclofen  for muscle spasm Consider re-initiation of gabapentin  if symptoms progress and tolerated Continue SI joint and core strengthening exercises Consider SI joint injection if not responsive to conservative treatment L1 vertebral compression fracture Stable, nondisplaced Emphasis on lumbar stabilization and core strengthening Avoid high-impact or aggravating activities  Physical Exam Palpation: Tenderness at origin  of right levator scapula, right  greater trochanter, right SI joint, and mid-inferior aspect of right gluteus medius Nontender in right gluteus medius/minimus otherwise, TFL, and piriformis Special tests: Positive straight leg raise on right, negative piriformis test, equivocal FADIR, positive TENS test localizing to right  Right-sided sciatica Past history L1 compression fracture and SI joint dysfunction most likely contributory Positive straight leg raise and radicular symptoms support nerve involvement Start meloxicam  15 mg daily with food x 1 week then daily as needed Baclofen  for muscle spasm, 1-2 tablets 3 times daily as needed. Consider re-initiation of gabapentin  if symptoms progress and tolerated, dose nightly on an as-needed basis Continue SI joint and core strengthening exercises, see separate MyChart message with exercises Consider SI joint injection if not responsive to conservative treatment, can consider at follow-up L1 vertebral compression fracture Stable Emphasis on lumbar stabilization and core strengthening Avoid high-impact or aggravating activities      Relevant Medications   meloxicam  (MOBIC ) 15 MG tablet   gabapentin  (NEURONTIN ) 100 MG capsule   Baclofen  5 MG TABS   Tinea corporis   Cutaneous symptoms Persistent tinea corporis at left upper arm despite OTC treatment  Tinea corporis Treat with Rx ketoconazole  2% topical cream once daily for 1-3 weeks until clinical resolution      Relevant Medications   ketoconazole  (NIZORAL ) 2 % cream   I provided a total time of 43 minutes including both face-to-face and non-face-to-face time on 06/12/2024 inclusive of time utilized for medical chart review, information gathering, care coordination with staff, and documentation completion.   Orders & Medications Medications:  Meds ordered this encounter  Medications   meloxicam  (MOBIC ) 15 MG tablet    Sig: Take 1 tablet (15 mg total) by mouth daily. X 1 week then daily as needed.  Take with food.     Dispense:  30 tablet    Refill:  0   gabapentin  (NEURONTIN ) 100 MG capsule    Sig: Take 1 capsule (100 mg total) by mouth at bedtime as needed.    Dispense:  30 capsule    Refill:  0   Baclofen  5 MG TABS    Sig: Take 1-2 tablets (5-10 mg total) by mouth 3 (three) times daily as needed (Muscle tightness pain).    Dispense:  90 tablet    Refill:  0   ketoconazole  (NIZORAL ) 2 % cream    Sig: Apply 1 Application topically daily. Apply to affected and surrounding area(s) once daily until clinical resolution, typically 1 to 3 weeks    Dispense:  30 g    Refill:  0   No orders of the defined types were placed in this encounter.    Return in about 2 months (around 08/13/2024) for 40 minute for EITHER CPE OR CSI.     Selinda JINNY Ku, MD, [facility]   Primary Care Sports Medicine Primary Care and Sports Medicine at [facility]

## 2024-06-12 NOTE — Assessment & Plan Note (Signed)
 Cutaneous symptoms Persistent tinea corporis at left upper arm despite OTC treatment  Tinea corporis Treat with Rx ketoconazole  2% topical cream once daily for 1-3 weeks until clinical resolution

## 2024-08-14 ENCOUNTER — Ambulatory Visit: Admitting: Family Medicine

## 2024-08-17 ENCOUNTER — Ambulatory Visit
Admission: RE | Admit: 2024-08-17 | Discharge: 2024-08-17 | Disposition: A | Discharge status: Home / Self Care | Source: Ambulatory Visit | Attending: Family Medicine

## 2024-08-17 ENCOUNTER — Encounter: Payer: Self-pay | Admitting: Family Medicine

## 2024-08-17 ENCOUNTER — Ambulatory Visit (INDEPENDENT_AMBULATORY_CARE_PROVIDER_SITE_OTHER): Admitting: Family Medicine

## 2024-08-17 ENCOUNTER — Ambulatory Visit
Admission: RE | Admit: 2024-08-17 | Discharge: 2024-08-17 | Disposition: A | Discharge status: Home / Self Care | Attending: Family Medicine | Admitting: Family Medicine

## 2024-08-17 VITALS — BP 116/78 | HR 83 | Ht 67.0 in | Wt 203.0 lb

## 2024-08-17 DIAGNOSIS — M4856XA Collapsed vertebra, not elsewhere classified, lumbar region, initial encounter for fracture: Secondary | ICD-10-CM | POA: Diagnosis not present

## 2024-08-17 DIAGNOSIS — S32010S Wedge compression fracture of first lumbar vertebra, sequela: Secondary | ICD-10-CM

## 2024-08-17 DIAGNOSIS — G4709 Other insomnia: Secondary | ICD-10-CM | POA: Diagnosis not present

## 2024-08-17 DIAGNOSIS — J309 Allergic rhinitis, unspecified: Secondary | ICD-10-CM | POA: Diagnosis not present

## 2024-08-17 DIAGNOSIS — M2550 Pain in unspecified joint: Secondary | ICD-10-CM | POA: Diagnosis not present

## 2024-08-17 DIAGNOSIS — Z0000 Encounter for general adult medical examination without abnormal findings: Secondary | ICD-10-CM | POA: Diagnosis not present

## 2024-08-17 DIAGNOSIS — M461 Sacroiliitis, not elsewhere classified: Secondary | ICD-10-CM | POA: Diagnosis not present

## 2024-08-17 DIAGNOSIS — F909 Attention-deficit hyperactivity disorder, unspecified type: Secondary | ICD-10-CM | POA: Diagnosis not present

## 2024-08-17 DIAGNOSIS — M62838 Other muscle spasm: Secondary | ICD-10-CM | POA: Diagnosis not present

## 2024-08-17 DIAGNOSIS — M5417 Radiculopathy, lumbosacral region: Secondary | ICD-10-CM | POA: Diagnosis not present

## 2024-08-17 DIAGNOSIS — Z1231 Encounter for screening mammogram for malignant neoplasm of breast: Secondary | ICD-10-CM

## 2024-08-17 MED ORDER — NAPROXEN 500 MG PO TABS: 500.0000 mg | ORAL_TABLET | Freq: Two times a day (BID) | ORAL | 1 refills | Status: DC | PRN

## 2024-08-17 MED ORDER — TRAZODONE HCL 50 MG PO TABS: 25.0000 mg | ORAL_TABLET | Freq: Every evening | ORAL | 0 refills | Status: DC | PRN

## 2024-08-17 NOTE — Assessment & Plan Note (Signed)
 Annual examination completed, risk stratification labs ordered, anticipatory guidance provided.  We will follow labs once resulted.

## 2024-08-17 NOTE — Assessment & Plan Note (Signed)
 Cervicalgia and myofascial neck pain - Persistent neck pain and tight neck muscles since July 8th - Baclofen  used as needed for muscle relaxation - Receives massages for symptomatic relief - Suspects new bed may contribute to discomfort; sleeping on right side causes soreness in right arm and side - Has attempted bed adjustments including rotation, mattress topper, and plans to add a box spring  Neck muscle pain with associated chronic right ear pain Ears clear, suggesting referred pain from neck muscles. Likely related to muscle tension and posture. - Refer to physical therapy. - Continue baclofen  as needed.

## 2024-08-17 NOTE — Assessment & Plan Note (Signed)
 Sleep disturbance and fatigue - No significant sleep issues, but variability in sleep quality, especially around work schedule - Daily exhaustion attributed to normal life circumstances  Insomnia Difficulty maintaining sleep, especially before work. Previous trazodone  use noted. Quality sleep is crucial for cognitive function. - Prescribe trazodone  50 mg, start with half a tablet as needed, can increase by 25 mg increments to a maximum of 100 mg nightly as-needed

## 2024-08-17 NOTE — Assessment & Plan Note (Signed)
 Allergic rhinitis and otologic symptoms - Persistent allergies, particularly affecting ears - Improvement with over-the-counter ear drops and nasal spray - Soreness in bones on both sides of head, suspected to be sinus-related

## 2024-08-17 NOTE — Progress Notes (Signed)
 Annual Physical Exam Visit  Patient Information:  Patient ID: Katherine Johnston, female DOB: Oct 13, 1981 Age: 43 y.o. MRN: [id number]   Subjective:   CC: Annual Physical Exam  HPI:  Katherine Johnston is here for their annual physical.  I reviewed the past medical history, family history, social history, surgical history, and allergies today and changes were made as necessary.  Please see the problem list section below for additional details.  Past Medical History: Past Medical History:  Diagnosis Date   ADD (attention deficit disorder) 2008   Dx via [facility]   Closed fracture of metatarsal bone 03/26/2022   Dupuytren's contracture of foot 06/14/2008   Past Surgical History: Past Surgical History:  Procedure Laterality Date   MANDIBLE SURGERY  2023   Surgery following complication from wisdom tooth extraction   WISDOM TOOTH EXTRACTION Bilateral 1997   Family History: Family History  Problem Relation Age of Onset   Heart disease Mother    Kidney disease Mother    Endometrial cancer Mother 6   Diabetes Father 62 - 79   Coronary artery disease Father    Heart disease Father    Hyperlipidemia Father    Hypertension Father    Diabetes Brother    ADD / ADHD Brother    Sleep apnea Brother    Heart disease Paternal Grandfather    Allergies: Allergies  Allergen Reactions   Penicillins Hives   Penicillin V Hives   Health Maintenance: Health Maintenance  Topic Date Due   Mammogram  Never done   Cervical Cancer Screening (Pap smear)  08/29/2024   COVID-19 Vaccine (1 - 2024-25 season) 09/02/2024 (Originally 08/06/2024)   Influenza Vaccine  03/05/2025 (Originally 07/06/2024)   Hepatitis B Vaccines 19-59 Average Risk (1 of 3 - 19+ 3-dose series) 08/17/2025 (Originally 08/20/1999)   HPV VACCINES (3 - 3-dose series) 08/17/2025 (Originally 01/14/2007)   DTaP/Tdap/Td (2 - Td or Tdap) 05/10/2033   Hepatitis C Screening  Completed   HIV Screening  Completed    Pneumococcal Vaccine  Aged Out   Meningococcal B Vaccine  Aged Out    HM Colonoscopy   This patient has no relevant Health Maintenance data.    Medications: Current Outpatient Medications on File Prior to Visit  Medication Sig Dispense Refill   Baclofen  5 MG TABS Take 1-2 tablets (5-10 mg total) by mouth 3 (three) times daily as needed (Muscle tightness pain). 90 tablet 0   norgestimate -ethinyl estradiol  (ORTHO-CYCLEN) 0.25-35 MG-MCG tablet Continuous dosing 30 tablet 5   ketoconazole  (NIZORAL ) 2 % cream Apply 1 Application topically daily. Apply to affected and surrounding area(s) once daily until clinical resolution, typically 1 to 3 weeks 30 g 0   meloxicam  (MOBIC ) 15 MG tablet Take 1 tablet (15 mg total) by mouth daily. X 1 week then daily as needed.  Take with food. (Patient not taking: Reported on 08/17/2024) 30 tablet 0   No current facility-administered medications on file prior to visit.    Discussed the use of AI scribe software for clinical note transcription with the patient, who gave verbal consent to proceed.   Objective:   Vitals:   08/17/24 1425  BP: 116/78  Pulse: 83  SpO2: 98%   Vitals:   08/17/24 1425  Weight: 203 lb (92.1 kg)  Height: 5' 7 (1.702 m)   Body mass index is 31.79 kg/m.  General: Well Developed, well nourished, and in no acute distress.  Neuro: Alert and oriented x3, extra-ocular muscles intact,  sensation grossly intact. Cranial nerves II through XII are grossly intact, motor, sensory, and coordinative functions are intact. HEENT: Normocephalic, atraumatic, neck supple, no masses, no lymphadenopathy, thyroid  nonenlarged. Oropharynx, nasopharynx, external ear canals are unremarkable. Skin: Warm and dry, no rashes noted.  Cardiac: Regular rate and rhythm, no murmurs rubs or gallops. No peripheral edema. Pulses symmetric. Respiratory: Clear to auscultation bilaterally. Speaking in full sentences.  Abdominal: Soft, nontender, nondistended,  positive bowel sounds, no masses, no organomegaly. Musculoskeletal: Stable, and with full range of motion.  Impression and Recommendations:   The patient was counselled, risk factors were discussed, and anticipatory guidance given.  Problem List Items Addressed This Visit     ADHD--sees psych for meds   Attention deficit symptoms - Not currently taking medications for ADD - Work schedule adjusted to accommodate symptoms, allowing management without medication  Attention deficit disorder Symptoms managed with lifestyle adjustments and work accommodations. No current medication needed.      Allergic rhinitis   Allergic rhinitis and otologic symptoms - Persistent allergies, particularly affecting ears - Improvement with over-the-counter ear drops and nasal spray - Soreness in bones on both sides of head, suspected to be sinus-related      Cervical paraspinal muscle spasm   Cervicalgia and myofascial neck pain - Persistent neck pain and tight neck muscles since July 8th - Baclofen  used as needed for muscle relaxation - Receives massages for symptomatic relief - Suspects new bed may contribute to discomfort; sleeping on right side causes soreness in right arm and side - Has attempted bed adjustments including rotation, mattress topper, and plans to add a box spring  Neck muscle pain with associated chronic right ear pain Ears clear, suggesting referred pain from neck muscles. Likely related to muscle tension and posture. - Refer to physical therapy. - Continue baclofen  as needed.      Compression fracture of lumbar vertebra (HCC)   Relevant Orders   DG Lumbar Spine Complete   DG Si Joints   Healthcare maintenance - Primary   Annual examination completed, risk stratification labs ordered, anticipatory guidance provided.  We will follow labs once resulted.      Relevant Orders   Hemoglobin A1c   CBC with Differential/Platelet   Comprehensive metabolic panel with GFR    Lipid panel   Other insomnia   Sleep disturbance and fatigue - No significant sleep issues, but variability in sleep quality, especially around work schedule - Daily exhaustion attributed to normal life circumstances  Insomnia Difficulty maintaining sleep, especially before work. Previous trazodone  use noted. Quality sleep is crucial for cognitive function. - Prescribe trazodone  50 mg, start with half a tablet as needed, can increase by 25 mg increments to a maximum of 100 mg nightly as-needed      Polyarthralgia   Relevant Orders   ANA 12Plus Profile, Do All RDL   Right lumbosacral radiculopathy   Lumbosacral radiculopathy and sciatica - Ongoing sciatica with pain radiating from low back across to right side - Meloxicam  provides partial relief - Gabapentin  causes shortness of breath, possibly psychological in origin - Baclofen  used occasionally at night - Performs regular stretching exercises for symptom management  Chronic low back pain and history of vertebral compression fracture - History of L1 compression fracture with prior imaging on January 27, 2023 - Received injections last summer with initial relief; pain has since recurred, especially across low back and more pronounced on right side - Pain sometimes elicited with pressure during massages  Chronic low back pain  with right-sided predominance, L1 compression fracture, right sacroiliac joint pain with sciatica Pain is more pronounced on the right side, extending across the low back. Previous CT showed 20% L1 compression fracture. Gabapentin  caused shortness of breath, likely anxiety-related. Meloxicam  and baclofen  provided some relief. - Refer to physical therapy for deconditioning and muscle imbalances. - Order x-ray of lumbar spine and SI joints. - Order autoimmune panel. - Prescribe naproxen  500 mg as needed. - Consider SI joint injection if pain interferes with physical therapy. - Consider MRI if pain persists after  six weeks of physical therapy.      Relevant Medications   traZODone  (DESYREL ) 50 MG tablet   Other Relevant Orders   DG Lumbar Spine Complete   DG Si Joints   Other Visit Diagnoses       Breast cancer screening by mammogram       Relevant Orders   MM DIGITAL SCREENING BILATERAL        Orders & Medications Medications:  Meds ordered this encounter  Medications   traZODone  (DESYREL ) 50 MG tablet    Sig: Take 0.5-2 tablets (25-100 mg total) by mouth at bedtime as needed.    Dispense:  30 tablet    Refill:  0   naproxen  (NAPROSYN ) 500 MG tablet    Sig: Take 1 tablet (500 mg total) by mouth 2 (two) times daily as needed.    Dispense:  60 tablet    Refill:  1   Orders Placed This Encounter  Procedures   MM DIGITAL SCREENING BILATERAL   DG Lumbar Spine Complete   DG Si Joints   Hemoglobin A1c   CBC with Differential/Platelet   Comprehensive metabolic panel with GFR   Lipid panel   ANA 12Plus Profile, Do All RDL     Return in about 1 year (around 08/17/2025) for CPE.    Selinda JINNY Ku, MD, [facility]   Primary Care Sports Medicine Primary [facility] and [facility] at [facility]

## 2024-08-17 NOTE — Assessment & Plan Note (Addendum)
 Lumbosacral radiculopathy and sciatica - Ongoing sciatica with pain radiating from low back across to right side - Meloxicam  provides partial relief - Gabapentin  causes shortness of breath, possibly psychological in origin - Baclofen  used occasionally at night - Performs regular stretching exercises for symptom management  Chronic low back pain and history of vertebral compression fracture - History of L1 compression fracture with prior imaging on January 27, 2023 - Received injections last summer with initial relief; pain has since recurred, especially across low back and more pronounced on right side - Pain sometimes elicited with pressure during massages  Chronic low back pain with right-sided predominance, L1 compression fracture, right sacroiliac joint pain with sciatica Pain is more pronounced on the right side, extending across the low back. Previous CT showed 20% L1 compression fracture. Gabapentin  caused shortness of breath, likely anxiety-related. Meloxicam  and baclofen  provided some relief. - Refer to physical therapy for deconditioning and muscle imbalances. - Order x-ray of lumbar spine and SI joints. - Order autoimmune panel. - Prescribe naproxen  500 mg as needed. - Consider SI joint injection if pain interferes with physical therapy. - Consider MRI if pain persists after six weeks of physical therapy.

## 2024-08-17 NOTE — Patient Instructions (Addendum)
-   Obtain fasting labs with orders provided (can have water or black coffee but otherwise no food or drink x 8 hours before labs) - Review information provided - Attend eye doctor annually, dentist every 6 months, work towards or maintain 30 minutes of moderate intensity physical activity at least 5 days per week, and consume a balanced diet - Return in 1 year for physical - Contact us  for any questions between now and then   ADHD  - Continue managing symptoms with work and lifestyle adjustments. - No medication changes needed at this time.  Allergic rhinitis  - Continue using over-the-counter ear drops and nasal spray for allergy symptoms. - Monitor for changes in ear or sinus symptoms.  Cervical paraspinal muscle spasm / Cervicalgia  - Continue baclofen  as needed for neck muscle tightness. - Refer to physical therapy for neck pain and muscle tension. - Continue massage therapy if helpful. - Adjust sleeping position and bed setup as needed for comfort. - Seek care right away if you develop sudden severe neck pain, weakness, or numbness in arms.  Compression fracture of lumbar vertebra / Chronic low back pain / Right lumbosacral radiculopathy / Sciatica  - Refer to physical therapy for back pain and muscle imbalances. - Continue meloxicam  and baclofen  as needed for pain. - Prescribed naproxen  500 mg as needed. - Perform regular stretching exercises. - X-rays of lumbar spine and SI joints have been ordered. - Autoimmune panel has been ordered. - Consider SI joint injection if pain interferes with physical therapy. - Consider MRI if pain persists after six weeks of physical therapy. - Seek care right away if you have sudden severe back pain, loss of bowel or bladder control, or new weakness or numbness in your legs.  Insomnia  - Start trazodone  50 mg: begin with half a tablet as needed, increase by 25 mg increments up to 100 mg nightly as needed for sleep.  Polyarthralgia  -  Autoimmune panel (ANA 12Plus Profile) has been ordered; complete labs as instructed.  Healthcare maintenance

## 2024-08-17 NOTE — Assessment & Plan Note (Signed)
 Attention deficit symptoms - Not currently taking medications for ADD - Work schedule adjusted to accommodate symptoms, allowing management without medication  Attention deficit disorder Symptoms managed with lifestyle adjustments and work accommodations. No current medication needed.

## 2024-08-20 ENCOUNTER — Encounter: Payer: Self-pay | Admitting: Family Medicine

## 2024-08-21 ENCOUNTER — Other Ambulatory Visit: Payer: Self-pay

## 2024-08-21 DIAGNOSIS — M5417 Radiculopathy, lumbosacral region: Secondary | ICD-10-CM

## 2024-08-21 NOTE — Progress Notes (Signed)
 Physical therapy ordered for lumbar pain.  JM

## 2024-08-24 LAB — CBC WITH DIFFERENTIAL/PLATELET
Basophils Absolute: 0.1 x10E3/uL (ref 0.0–0.2)
Basos: 1 %
EOS (ABSOLUTE): 0.1 x10E3/uL (ref 0.0–0.4)
Eos: 2 %
Hematocrit: 41.8 % (ref 34.0–46.6)
Hemoglobin: 13.6 g/dL (ref 11.1–15.9)
Immature Grans (Abs): 0 x10E3/uL (ref 0.0–0.1)
Immature Granulocytes: 0 %
Lymphocytes Absolute: 2.7 x10E3/uL (ref 0.7–3.1)
Lymphs: 40 %
MCH: 30.6 pg (ref 26.6–33.0)
MCHC: 32.5 g/dL (ref 31.5–35.7)
MCV: 94 fL (ref 79–97)
Monocytes Absolute: 0.4 x10E3/uL (ref 0.1–0.9)
Monocytes: 6 %
Neutrophils Absolute: 3.5 x10E3/uL (ref 1.4–7.0)
Neutrophils: 51 %
Platelets: 297 x10E3/uL (ref 150–450)
RBC: 4.44 x10E6/uL (ref 3.77–5.28)
RDW: 12 % (ref 11.7–15.4)
WBC: 6.8 x10E3/uL (ref 3.4–10.8)

## 2024-08-24 LAB — LIPID PANEL
Chol/HDL Ratio: 4.8 ratio — ABNORMAL HIGH (ref 0.0–4.4)
Cholesterol, Total: 209 mg/dL — ABNORMAL HIGH (ref 100–199)
HDL: 44 mg/dL (ref >39)
LDL Chol Calc (NIH): 136 mg/dL — ABNORMAL HIGH (ref 0–99)
Triglycerides: 162 mg/dL — ABNORMAL HIGH (ref 0–149)
VLDL Cholesterol Cal: 29 mg/dL (ref 5–40)

## 2024-08-24 LAB — ANA 12PLUS PROFILE, DO ALL RDL
Anti-CCP Ab, IgG & IgA (RDL): <20 U (ref <20)
Anti-Cardiolipin Ab, IgA (RDL): <12 U/mL (ref <12)
Anti-Cardiolipin Ab, IgG (RDL): <15 GPL U/mL (ref <15)
Anti-Cardiolipin Ab, IgM (RDL): <13 [MPL'U]/mL (ref <13)
Anti-Centromere Ab (RDL): <1:40 {titer}
Anti-Chromatin Ab, IgG (RDL): <20 U (ref <20)
Anti-La (SS-B) Ab (RDL): <20 U (ref <20)
Anti-Nuclear Ab by IFA (RDL): POSITIVE — AB
Anti-Ro (SS-A) Ab (RDL): <20 U (ref <20)
Anti-Scl-70 Ab (RDL): <20 U (ref <20)
Anti-Sm Ab (RDL): <20 U (ref <20)
Anti-TPO Ab (RDL): <9 [IU]/mL (ref <9.0)
Anti-U1 RNP Ab (RDL): <20 U (ref <20)
Anti-dsDNA Ab by Farr(RDL): <8 [IU]/mL (ref <8.0)
C3 Complement (RDL): 210 mg/dL — ABNORMAL HIGH (ref 90–180)
C4 Complement (RDL): 37 mg/dL (ref 10–40)
Rheumatoid Factor by Turb RDL: <14 [IU]/mL (ref <14)

## 2024-08-24 LAB — ANA TITER AND PATTERN: Speckled Pattern: 1:80 {titer} — ABNORMAL HIGH

## 2024-08-24 LAB — HEMOGLOBIN A1C
Est. average glucose Bld gHb Est-mCnc: 97 mg/dL
Hgb A1c MFr Bld: 5 % (ref 4.8–5.6)

## 2024-08-24 LAB — COMPREHENSIVE METABOLIC PANEL WITH GFR
ALT: 12 IU/L (ref 0–32)
AST: 14 IU/L (ref 0–40)
Albumin: 4.2 g/dL (ref 3.9–4.9)
Alkaline Phosphatase: 57 IU/L (ref 44–121)
BUN/Creatinine Ratio: 12 (ref 9–23)
BUN: 10 mg/dL (ref 6–24)
Bilirubin Total: 0.3 mg/dL (ref 0.0–1.2)
CO2: 20 mmol/L (ref 20–29)
Calcium: 9 mg/dL (ref 8.7–10.2)
Chloride: 104 mmol/L (ref 96–106)
Creatinine, Ser: 0.85 mg/dL (ref 0.57–1.00)
Globulin, Total: 2.6 g/dL (ref 1.5–4.5)
Glucose: 91 mg/dL (ref 70–99)
Potassium: 4.5 mmol/L (ref 3.5–5.2)
Sodium: 140 mmol/L (ref 134–144)
Total Protein: 6.8 g/dL (ref 6.0–8.5)
eGFR: 87 mL/min/1.73 (ref >59)

## 2024-08-27 ENCOUNTER — Ambulatory Visit: Payer: Self-pay | Admitting: Family Medicine

## 2024-08-27 DIAGNOSIS — Z1231 Encounter for screening mammogram for malignant neoplasm of breast: Secondary | ICD-10-CM

## 2024-08-28 NOTE — Telephone Encounter (Signed)
 Please review.  KP

## 2024-08-30 ENCOUNTER — Ambulatory Visit: Attending: Family Medicine | Admitting: Physical Therapy

## 2024-08-30 DIAGNOSIS — M6281 Muscle weakness (generalized): Secondary | ICD-10-CM | POA: Diagnosis present

## 2024-08-30 DIAGNOSIS — M5416 Radiculopathy, lumbar region: Secondary | ICD-10-CM | POA: Insufficient documentation

## 2024-08-30 DIAGNOSIS — M5459 Other low back pain: Secondary | ICD-10-CM | POA: Insufficient documentation

## 2024-08-30 DIAGNOSIS — M5417 Radiculopathy, lumbosacral region: Secondary | ICD-10-CM | POA: Insufficient documentation
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
## 2024-09-04 ENCOUNTER — Ambulatory Visit: Admitting: Physical Therapy
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
## 2024-09-06 ENCOUNTER — Encounter: Admitting: Physical Therapy

## 2024-09-10 DIAGNOSIS — K645 Perianal venous thrombosis: Secondary | ICD-10-CM | POA: Diagnosis not present

## 2024-09-11 ENCOUNTER — Encounter: Payer: Self-pay | Admitting: Family Medicine

## 2024-09-11 ENCOUNTER — Ambulatory Visit (INDEPENDENT_AMBULATORY_CARE_PROVIDER_SITE_OTHER): Admitting: Family Medicine

## 2024-09-11 ENCOUNTER — Ambulatory Visit: Admitting: Physical Therapy

## 2024-09-11 VITALS — BP 98/62 | HR 102 | Ht 67.0 in | Wt 202.0 lb

## 2024-09-11 DIAGNOSIS — D7589 Other specified diseases of blood and blood-forming organs: Secondary | ICD-10-CM

## 2024-09-11 DIAGNOSIS — E559 Vitamin D deficiency, unspecified: Secondary | ICD-10-CM | POA: Diagnosis not present

## 2024-09-11 DIAGNOSIS — K5909 Other constipation: Secondary | ICD-10-CM | POA: Diagnosis not present

## 2024-09-11 DIAGNOSIS — K645 Perianal venous thrombosis: Secondary | ICD-10-CM | POA: Insufficient documentation

## 2024-09-11 NOTE — Patient Instructions (Signed)
 VISIT SUMMARY:  During your visit, we discussed your severe thrombosed hemorrhoid and rectal bleeding, chronic constipation, and hypercholesterolemia. We have made a plan to address each of these issues and ensure you receive the appropriate care.  YOUR PLAN:  THROMBOSED EXTERNAL HEMORRHOID WITH RECTAL BLEEDING: You have a severe thrombosed hemorrhoid causing significant pain and rectal bleeding. -We have made an urgent referral to a gastroenterologist at [facility]. If there is a delay in getting an appointment, please contact [facility]. -We discussed potential treatments such as band ligation or phototherapy.  CHRONIC CONSTIPATION: Your chronic constipation is contributing to the hemorrhoid issues, but there have been recent improvements with your current regimen. -Continue taking Metamucil and Miralax as you have been. -Review the bowel regimen cheat sheet sent via MyChart and adjust as needed. -We will order tests to check your B12 and vitamin D  levels.

## 2024-09-11 NOTE — Assessment & Plan Note (Signed)
 Nutritional supplementation and concerns - Currently taking calcium with vitamin D  and zinc, vitamin C, fish oil, and collagen - Concerned about B12 and vitamin D  levels  - Order B12 and vitamin D  levels.

## 2024-09-11 NOTE — Progress Notes (Signed)
 Primary Care / Sports Medicine Office Visit  Patient Information:  Patient ID: Katherine Johnston, female DOB: September 19, 1981 Age: 43 y.o. MRN: [id number]   CIERRA Johnston is a pleasant 43 y.o. female presenting with the following:  Chief Complaint  Patient presents with   Hemorrhoids    Patient presents today for hemorrhoids. She had a hemorrhoid rupture yesterday and needs to get fixed. She would like a referral to specialist.     Vitals:   09/11/24 1440  BP: 98/62  Pulse: (!) 102  SpO2: 99%   Vitals:   09/11/24 1440  Weight: 202 lb (91.6 kg)  Height: 5' 7 (1.702 m)   Body mass index is 31.64 kg/m.  Discussed the use of AI scribe software for clinical note transcription with the patient, who gave verbal consent to proceed.   Independent interpretation of notes and tests performed by another provider:   None  Procedures performed:   None  Pertinent History, Exam, Impression, and Recommendations:   Problem List Items Addressed This Visit     Chronic constipation   Bowel habits and constipation - Chronic constipation, attributed to hereditary factors (father with similar issues) - Increased fiber intake and started Metamucil daily over the past few months, especially in the last three weeks - Recent addition of Miralax three times daily, in addition to morning Metamucil - Recent bowel movement described as 'the best', smooth and non-aggravating to the hemorrhoid - No recent changes in bowel habits aside from improvement with Miralax and Metamucil  Chronic constipation Chronic constipation contributing to hemorrhoid exacerbation. Recent improvements with current regimen. - Continue Metamucil and Miralax. - Review bowel regimen cheat sheet sent via MyChart and adjust as needed.      Thrombosed hemorrhoids - Primary   History of Present Illness Katherine Johnston is a 43 year old female with hemorrhoids who presents with a severe thrombosed hemorrhoid and rectal  bleeding. She was advised by urgent care to see a colorectal specialist due to the thrombosed hemorrhoid.  Thrombosed hemorrhoid and rectal bleeding - Severe thrombosed hemorrhoid with significant pain and swelling since last Thursday night - Hemorrhoid described as 'huge, hard, and very painful' - Rectal bleeding began on Friday after feeling a clot pop while at work - Persistent rectal bleeding, currently decreasing in amount - History of hemorrhoid since childbirth 19 years ago, with intermittent flares, especially with constipation - Suppositories used over the weekend provided some relief  Thrombosed external hemorrhoid with rectal bleeding Chronic thrombosed external hemorrhoid with recent exacerbation causing significant pain and rectal bleeding. Delay in specialist availability for recommended colorectal consultation. - Urgent referral to gastroenterologist at [facility]. - Advise contact with [facility] if appointment delay occurs. - Discussed potential interventions such as band ligation.      Relevant Orders   Ambulatory referral to Gastroenterology   Vitamin D  deficiency   Nutritional supplementation and concerns - Currently taking calcium with vitamin D  and zinc, vitamin C, fish oil, and collagen - Concerned about B12 and vitamin D  levels  - Order B12 and vitamin D  levels.      Relevant Orders   VITAMIN D  25 Hydroxy (Vit-D Deficiency, Fractures)   Other Visit Diagnoses       Macrocytosis       Relevant Orders   B12        Orders & Medications Medications: No orders of the defined types were placed in this encounter.  Orders Placed This Encounter  Procedures   B12   VITAMIN D  25 Hydroxy (Vit-D Deficiency, Fractures)   Ambulatory referral to Gastroenterology     No follow-ups on file.     Selinda JINNY Ku, MD, [facility]   Primary Care Sports Medicine Primary Care and Sports Medicine at [facility]

## 2024-09-11 NOTE — Assessment & Plan Note (Signed)
 Bowel habits and constipation - Chronic constipation, attributed to hereditary factors (father with similar issues) - Increased fiber intake and started Metamucil daily over the past few months, especially in the last three weeks - Recent addition of Miralax three times daily, in addition to morning Metamucil - Recent bowel movement described as 'the best', smooth and non-aggravating to the hemorrhoid - No recent changes in bowel habits aside from improvement with Miralax and Metamucil  Chronic constipation Chronic constipation contributing to hemorrhoid exacerbation. Recent improvements with current regimen. - Continue Metamucil and Miralax. - Review bowel regimen cheat sheet sent via MyChart and adjust as needed.

## 2024-09-11 NOTE — Assessment & Plan Note (Signed)
 History of Present Illness Katherine Johnston is a 43 year old female with hemorrhoids who presents with a severe thrombosed hemorrhoid and rectal bleeding. She was advised by urgent care to see a colorectal specialist due to the thrombosed hemorrhoid.  Thrombosed hemorrhoid and rectal bleeding - Severe thrombosed hemorrhoid with significant pain and swelling since last Thursday night - Hemorrhoid described as 'huge, hard, and very painful' - Rectal bleeding began on Friday after feeling a clot pop while at work - Persistent rectal bleeding, currently decreasing in amount - History of hemorrhoid since childbirth 19 years ago, with intermittent flares, especially with constipation - Suppositories used over the weekend provided some relief  Thrombosed external hemorrhoid with rectal bleeding Chronic thrombosed external hemorrhoid with recent exacerbation causing significant pain and rectal bleeding. Delay in specialist availability for recommended colorectal consultation. - Urgent referral to gastroenterologist at [facility]. - Advise contact with [facility] if appointment delay occurs. - Discussed potential interventions such as band ligation.

## 2024-09-12 ENCOUNTER — Ambulatory Visit: Payer: Self-pay | Admitting: Family Medicine

## 2024-09-12 LAB — VITAMIN D 25 HYDROXY (VIT D DEFICIENCY, FRACTURES): Vit D, 25-Hydroxy: 37.1 ng/mL (ref 30.0–100.0)

## 2024-09-12 LAB — VITAMIN B12: Vitamin B-12: >2000 pg/mL — ABNORMAL HIGH (ref 232–1245)

## 2024-09-12 NOTE — Telephone Encounter (Signed)
 Can  you please forward her referral to [facility] clinic. Thank you.  Katherine Johnston

## 2024-09-13 ENCOUNTER — Encounter: Admitting: Physical Therapy

## 2024-09-18 ENCOUNTER — Ambulatory Visit: Attending: Family Medicine | Admitting: Physical Therapy

## 2024-09-18 DIAGNOSIS — M6281 Muscle weakness (generalized): Secondary | ICD-10-CM | POA: Insufficient documentation

## 2024-09-18 DIAGNOSIS — M5416 Radiculopathy, lumbar region: Secondary | ICD-10-CM | POA: Insufficient documentation

## 2024-09-18 DIAGNOSIS — M5459 Other low back pain: Secondary | ICD-10-CM | POA: Insufficient documentation
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
## 2024-09-20 ENCOUNTER — Ambulatory Visit: Admitting: Physical Therapy

## 2024-09-20 DIAGNOSIS — M5459 Other low back pain: Secondary | ICD-10-CM

## 2024-09-20 DIAGNOSIS — M6281 Muscle weakness (generalized): Secondary | ICD-10-CM | POA: Diagnosis present

## 2024-09-20 DIAGNOSIS — M5416 Radiculopathy, lumbar region: Secondary | ICD-10-CM | POA: Diagnosis present
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
## 2024-09-24 ENCOUNTER — Ambulatory Visit: Admitting: Physical Therapy

## 2024-09-24 ENCOUNTER — Encounter: Payer: Self-pay | Admitting: Physical Therapy

## 2024-09-24 DIAGNOSIS — M5459 Other low back pain: Secondary | ICD-10-CM

## 2024-09-24 DIAGNOSIS — M5416 Radiculopathy, lumbar region: Secondary | ICD-10-CM

## 2024-09-24 DIAGNOSIS — M6281 Muscle weakness (generalized): Secondary | ICD-10-CM
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
## 2024-09-25 ENCOUNTER — Encounter: Admitting: Physical Therapy

## 2024-09-26 ENCOUNTER — Ambulatory Visit: Admitting: Physical Therapy
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
## 2024-09-27 ENCOUNTER — Encounter: Admitting: Physical Therapy

## 2024-09-28 ENCOUNTER — Encounter: Payer: Self-pay | Admitting: Family Medicine

## 2024-09-28 ENCOUNTER — Other Ambulatory Visit: Payer: Self-pay

## 2024-09-28 DIAGNOSIS — L918 Other hypertrophic disorders of the skin: Secondary | ICD-10-CM

## 2024-10-02 ENCOUNTER — Ambulatory Visit: Admitting: Physical Therapy

## 2024-10-04 ENCOUNTER — Other Ambulatory Visit: Payer: Self-pay

## 2024-10-04 ENCOUNTER — Ambulatory Visit: Admitting: Physical Therapy

## 2024-10-04 ENCOUNTER — Other Ambulatory Visit: Payer: Self-pay | Admitting: Advanced Practice Midwife

## 2024-10-04 DIAGNOSIS — Z01419 Encounter for gynecological examination (general) (routine) without abnormal findings: Secondary | ICD-10-CM

## 2024-10-04 DIAGNOSIS — Z3041 Encounter for surveillance of contraceptive pills: Secondary | ICD-10-CM

## 2024-10-04 DIAGNOSIS — M6281 Muscle weakness (generalized): Secondary | ICD-10-CM

## 2024-10-04 DIAGNOSIS — M5459 Other low back pain: Secondary | ICD-10-CM

## 2024-10-04 DIAGNOSIS — M5416 Radiculopathy, lumbar region: Secondary | ICD-10-CM

## 2024-10-04 MED ORDER — NORGESTIMATE-ETH ESTRADIOL 0.25-35 MG-MCG PO TABS: 1.0000 | ORAL_TABLET | Freq: Every day | ORAL | 0 refills | Status: DC
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
## 2024-10-04 NOTE — Progress Notes (Signed)
 RF for birth control sent to pt pharmacy

## 2024-10-05 ENCOUNTER — Other Ambulatory Visit: Payer: Self-pay

## 2024-10-05 ENCOUNTER — Other Ambulatory Visit: Payer: Self-pay | Admitting: Advanced Practice Midwife

## 2024-10-05 ENCOUNTER — Other Ambulatory Visit: Payer: Self-pay | Admitting: Family Medicine

## 2024-10-05 DIAGNOSIS — Z3041 Encounter for surveillance of contraceptive pills: Secondary | ICD-10-CM

## 2024-10-05 DIAGNOSIS — Z01419 Encounter for gynecological examination (general) (routine) without abnormal findings: Secondary | ICD-10-CM

## 2024-10-05 DIAGNOSIS — K645 Perianal venous thrombosis: Secondary | ICD-10-CM

## 2024-10-05 MED ORDER — NORGESTIMATE-ETH ESTRADIOL 0.25-35 MG-MCG PO TABS
1.0000 | ORAL_TABLET | Freq: Every day | ORAL | 0 refills | Status: AC
Start: 2024-10-05 — End: 2025-01-03

## 2024-10-05 NOTE — Progress Notes (Signed)
 Order erroneously printed.  Rx edited and resubmitted to patient pharmacy with quantity reflecting 90 days with continuous dosing.

## 2024-10-09 ENCOUNTER — Ambulatory Visit: Admitting: Family Medicine

## 2024-10-11 ENCOUNTER — Ambulatory Visit: Admitting: Physical Therapy
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
## 2024-10-16 ENCOUNTER — Ambulatory Visit

## 2024-10-16 DIAGNOSIS — W908XXA Exposure to other nonionizing radiation, initial encounter: Secondary | ICD-10-CM | POA: Diagnosis not present

## 2024-10-16 DIAGNOSIS — L720 Epidermal cyst: Secondary | ICD-10-CM | POA: Diagnosis not present

## 2024-10-16 DIAGNOSIS — L578 Other skin changes due to chronic exposure to nonionizing radiation: Secondary | ICD-10-CM | POA: Diagnosis not present

## 2024-10-16 DIAGNOSIS — L814 Other melanin hyperpigmentation: Secondary | ICD-10-CM

## 2024-10-16 DIAGNOSIS — D489 Neoplasm of uncertain behavior, unspecified: Secondary | ICD-10-CM

## 2024-10-16 DIAGNOSIS — L905 Scar conditions and fibrosis of skin: Secondary | ICD-10-CM | POA: Diagnosis not present

## 2024-10-16 DIAGNOSIS — D2239 Melanocytic nevi of other parts of face: Secondary | ICD-10-CM | POA: Diagnosis not present

## 2024-10-16 DIAGNOSIS — L821 Other seborrheic keratosis: Secondary | ICD-10-CM

## 2024-10-16 DIAGNOSIS — L918 Other hypertrophic disorders of the skin: Secondary | ICD-10-CM | POA: Diagnosis not present

## 2024-10-16 DIAGNOSIS — D229 Melanocytic nevi, unspecified: Secondary | ICD-10-CM

## 2024-10-16 NOTE — Progress Notes (Signed)
 Subjective   Katherine Johnston is a 43 y.o. female who presents for the following: Lesion(s) of concern . Patient is new patient  Today patient reports: Area of concern on her nose Area of concern on her right axilla Area of concern on her right hip  Review of Systems:    No other skin or systemic complaints except as noted in HPI or Assessment and Plan.  The following portions of the chart were reviewed this encounter and updated as appropriate: medications, allergies, medical history  Relevant Medical History:  n/a   Objective  (SKPE) Well appearing patient in no apparent distress; mood and affect are within normal limits. Examination was performed of the: Sun Exposed Exam: Scalp, head, eyes, ears, nose, lips, neck, upper extremities, hands, fingers, fingernails  Examination notable for: Lentigo/lentigines: Scattered pigmented macules that are tan to brown in color and are somewhat non-uniform in shape and concentrated in the sun-exposed areas, Nevus/nevi: Scattered well-demarcated, regular, pigmented macule(s) and/or papule(s)  , Actinic Damage/Elastosis: chronic sun damage: dyspigmentation, telangiectasia, and wrinkling, Acrochordon(s): soft, fleshy, skin-colored to tan pedunculated papules located on the axilla and trunk - A firm, skin-colored, subcutaneous nodule that is freely movable and has a central punctum located on the right hip Examination limited by: Clothing and Patient deferred removal     Left lateral nose 5mm pink firm papule    Assessment & Plan  (SKAP)    BENIGN SKIN FINDINGS  - Lentigines  - Nevus/Multiple Benign Nevi - Reassurance provided regarding the benign appearance of lesions noted on exam today; no treatment is indicated in the absence of symptoms/changes. - Reinforced importance of photoprotective strategies including liberal and frequent sunscreen use of a broad-spectrum SPF 30 or greater, use of protective clothing, and sun avoidance for  prevention of cutaneous malignancy and photoaging.  Counseled patient on the importance of regular self-skin monitoring as well as routine clinical skin examinations as scheduled.   ACTINIC DAMAGE - Chronic condition, secondary to cumulative UV/sun exposure - Recommend daily broad spectrum sunscreen SPF 30+ to sun-exposed areas, reapply every 2 hours as needed.  - Staying in the shade or wearing long sleeves, sun glasses (UVA+UVB protection) and wide brim hats (4-inch brim around the entire circumference of the hat) are also recommended for sun protection.  - Call for new or changing lesions.  Acrochordons - Benign, patient reassured of the benign nature of acrochrodons  - Explained that more lesions may appear over time - Deferred removal   Subcutaneous cyst, favor epidermal inclusion cyst - Explained to patient this most likely is consistent with an epidermal inclusion cyst, which represents trapped hair follicule and skin cells under the skin - Benign, patient reassured - Given that the lesion is symptomatic, patient would like to proceed with excision. They will be scheduled for surgical removal.  Level of service outlined above   Patient instructions (SKPI)   Procedures, orders, diagnosis for this visit:  NEOPLASM OF UNCERTAIN BEHAVIOR Left lateral nose Epidermal / dermal shaving  Lesion diameter (cm):  0.5 Informed consent: discussed and consent obtained   Timeout: patient name, date of birth, surgical site, and procedure verified   Procedure prep:  Patient was prepped and draped in usual sterile fashion Prep type:  Isopropyl alcohol Anesthesia: the lesion was anesthetized in a standard fashion   Anesthetic:  1% lidocaine w/ epinephrine 1-100,000 buffered w/ 8.4% NaHCO3 Instrument used: DermaBlade   Hemostasis achieved with: pressure and aluminum chloride   Outcome: patient tolerated procedure  well   Post-procedure details: wound care instructions given     Neoplasm of  uncertain behavior -     Epidermal / dermal shaving    Return to clinic: Return for Surgery for cyst removal on right hip.  I, Emerick Ege, CMA am acting as scribe for Lauraine JAYSON Kanaris, MD.   Documentation: I have reviewed the above documentation for accuracy and completeness, and I agree with the above.  Lauraine JAYSON Kanaris, MD

## 2024-10-16 NOTE — Patient Instructions (Signed)

## 2024-10-19 LAB — SURGICAL PATHOLOGY

## 2024-10-22 ENCOUNTER — Ambulatory Visit: Payer: Self-pay

## 2024-10-22 NOTE — Telephone Encounter (Signed)
Left message on voicemail to return my call.  

## 2024-10-22 NOTE — Telephone Encounter (Signed)
-----   Message from Lauraine JAYSON Kanaris sent at 10/22/2024  8:57 AM EST -----  1. Skin, left lateral nose :       MELANOCYTIC NEVUS WITH SCAR, BASE INVOLVED, SEE DESCRIPTION  Please notify patient with below plan: Benign, observe.   ----- Message ----- From: Interface, [facility] Sent: 10/19/2024   4:23 PM EST To: Lauraine JAYSON Kanaris, MD

## 2024-10-23 NOTE — Telephone Encounter (Signed)
 Patient informed of pathology results

## 2024-10-23 NOTE — Telephone Encounter (Signed)
-----   Message from Lauraine JAYSON Kanaris sent at 10/22/2024  8:57 AM EST -----  1. Skin, left lateral nose :       MELANOCYTIC NEVUS WITH SCAR, BASE INVOLVED, SEE DESCRIPTION  Please notify patient with below plan: Benign, observe.   ----- Message ----- From: Interface, [facility] Sent: 10/19/2024   4:23 PM EST To: Lauraine JAYSON Kanaris, MD

## 2024-10-24 ENCOUNTER — Ambulatory Visit: Admitting: Physical Therapy

## 2024-10-25 ENCOUNTER — Ambulatory Visit: Admitting: General Surgery

## 2024-11-19 ENCOUNTER — Ambulatory Visit: Admitting: Advanced Practice Midwife

## 2024-11-19 ENCOUNTER — Other Ambulatory Visit (HOSPITAL_COMMUNITY)
Admission: RE | Admit: 2024-11-19 | Discharge: 2024-11-19 | Disposition: A | Discharge status: Home / Self Care | Source: Ambulatory Visit | Attending: Advanced Practice Midwife | Admitting: Advanced Practice Midwife

## 2024-11-19 ENCOUNTER — Encounter: Payer: Self-pay | Admitting: Advanced Practice Midwife

## 2024-11-19 VITALS — BP 127/90 | HR 89 | Ht 67.0 in | Wt 203.4 lb

## 2024-11-19 DIAGNOSIS — Z01419 Encounter for gynecological examination (general) (routine) without abnormal findings: Secondary | ICD-10-CM

## 2024-11-19 DIAGNOSIS — Z131 Encounter for screening for diabetes mellitus: Secondary | ICD-10-CM

## 2024-11-19 DIAGNOSIS — Z1159 Encounter for screening for other viral diseases: Secondary | ICD-10-CM | POA: Diagnosis not present

## 2024-11-19 DIAGNOSIS — Z124 Encounter for screening for malignant neoplasm of cervix: Secondary | ICD-10-CM | POA: Diagnosis present

## 2024-11-19 DIAGNOSIS — Z01411 Encounter for gynecological examination (general) (routine) with abnormal findings: Secondary | ICD-10-CM | POA: Diagnosis not present

## 2024-11-19 DIAGNOSIS — Z13220 Encounter for screening for lipoid disorders: Secondary | ICD-10-CM | POA: Diagnosis not present

## 2024-11-19 DIAGNOSIS — R5383 Other fatigue: Secondary | ICD-10-CM | POA: Diagnosis not present

## 2024-11-19 DIAGNOSIS — Z1321 Encounter for screening for nutritional disorder: Secondary | ICD-10-CM | POA: Diagnosis not present

## 2024-11-19 DIAGNOSIS — Z113 Encounter for screening for infections with a predominantly sexual mode of transmission: Secondary | ICD-10-CM

## 2024-11-19 DIAGNOSIS — Z1331 Encounter for screening for depression: Secondary | ICD-10-CM

## 2024-11-19 DIAGNOSIS — Z3041 Encounter for surveillance of contraceptive pills: Secondary | ICD-10-CM

## 2024-11-19 DIAGNOSIS — N898 Other specified noninflammatory disorders of vagina: Secondary | ICD-10-CM

## 2024-11-19 MED ORDER — NORGESTIMATE-ETH ESTRADIOL 0.25-35 MG-MCG PO TABS: 1.0000 | ORAL_TABLET | Freq: Every day | ORAL | 4 refills | Status: AC

## 2024-11-19 NOTE — Patient Instructions (Signed)
 Preventive Care 27-44 Years Old, Female Preventive care refers to lifestyle choices and visits with your health care provider that can promote health and wellness. Preventive care visits are also called wellness exams. What can I expect for my preventive care visit? Counseling Your health care provider may ask you questions about your: Medical history, including: Past medical problems. Family medical history. Pregnancy history. Current health, including: Menstrual cycle. Method of birth control. Emotional well-being. Home life and relationship well-being. Sexual activity and sexual health. Lifestyle, including: Alcohol, nicotine or tobacco, and drug use. Access to firearms. Diet, exercise, and sleep habits. Work and work astronomer. Sunscreen use. Safety issues such as seatbelt and bike helmet use. Physical exam Your health care provider will check your: Height and weight. These may be used to calculate your BMI (body mass index). BMI is a measurement that tells if you are at a healthy weight. Waist circumference. This measures the distance around your waistline. This measurement also tells if you are at a healthy weight and may help predict your risk of certain diseases, such as type 2 diabetes and high blood pressure. Heart rate and blood pressure. Body temperature. Skin for abnormal spots. What immunizations do I need?  Vaccines are usually given at various ages, according to a schedule. Your health care provider will recommend vaccines for you based on your age, medical history, and lifestyle or other factors, such as travel or where you work. What tests do I need? Screening Your health care provider may recommend screening tests for certain conditions. This may include: Lipid and cholesterol levels. Diabetes screening. This is done by checking your blood sugar (glucose) after you have not eaten for a while (fasting). Pelvic exam and Pap test. Hepatitis B test. Hepatitis C  test. HIV (human immunodeficiency virus) test. STI (sexually transmitted infection) testing, if you are at risk. Lung cancer screening. Colorectal cancer screening. Mammogram. Talk with your health care provider about when you should start having regular mammograms. This may depend on whether you have a family history of breast cancer. BRCA-related cancer screening. This may be done if you have a family history of breast, ovarian, tubal, or peritoneal cancers. Bone density scan. This is done to screen for osteoporosis. Talk with your health care provider about your test results, treatment options, and if necessary, the need for more tests. Follow these instructions at home: Eating and drinking  Eat a diet that includes fresh fruits and vegetables, whole grains, lean protein, and low-fat dairy products. Take vitamin and mineral supplements as recommended by your health care provider. Do not drink alcohol if: Your health care provider tells you not to drink. You are pregnant, may be pregnant, or are planning to become pregnant. If you drink alcohol: Limit how much you have to 0-1 drink a day. Know how much alcohol is in your drink. In the [place] one drink equals one 12 oz bottle of beer (355 mL), one 5 oz glass of wine (148 mL), or one 1 oz glass of hard liquor (44 mL). Lifestyle Brush your teeth every morning and night with fluoride toothpaste. Floss one time each day. Exercise for at least 30 minutes 5 or more days each week. Do not use any products that contain nicotine or tobacco. These products include cigarettes, chewing tobacco, and vaping devices, such as e-cigarettes. If you need help quitting, ask your health care provider. Do not use drugs. If you are sexually active, practice safe sex. Use a condom or other form of protection to  prevent STIs. If you do not wish to become pregnant, use a form of birth control. If you plan to become pregnant, see your health care provider for a  prepregnancy visit. Take aspirin only as told by your health care provider. Make sure that you understand how much to take and what form to take. Work with your health care provider to find out whether it is safe and beneficial for you to take aspirin daily. Find healthy ways to manage stress, such as: Meditation, yoga, or listening to music. Journaling. Talking to a trusted person. Spending time with friends and family. Minimize exposure to UV radiation to reduce your risk of skin cancer. Safety Always wear your seat belt while driving or riding in a vehicle. Do not drive: If you have been drinking alcohol. Do not ride with someone who has been drinking. When you are tired or distracted. While texting. If you have been using any mind-altering substances or drugs. Wear a helmet and other protective equipment during sports activities. If you have firearms in your house, make sure you follow all gun safety procedures. Seek help if you have been physically or sexually abused. What's next? Visit your health care provider once a year for an annual wellness visit. Ask your health care provider how often you should have your eyes and teeth checked. Stay up to date on all vaccines. This information is not intended to replace advice given to you by your health care provider. Make sure you discuss any questions you have with your health care provider. Document Revised: 05/20/2021 Document Reviewed: 05/20/2021 Elsevier Patient Education  2024 [employer] How to Do a Breast Self-Exam Doing breast self-exams can help you stay healthy. They're one way to know what's normal for your breasts. They can help you catch a problem while it's still small and can be treated. You need to: Check your breasts often. Tell your doctor about any changes. You should do breast self-exams even if you have breast implants. What you need: A mirror. A well-lit room. A pillow or other soft object. How to do a  breast self-exam Look for changes  Take off all the clothes above your waist. Stand in front of a mirror in a room with good lighting. Put your hands down at your sides. Compare your breasts in the mirror. Look for difference between them, such as: Differences in shape. Differences in size. Wrinkles, dips, and bumps in one breast and not the other. Look at each breast for skin changes, such as: Redness. Scaly spots. Spots where your skin is thicker. Dimpling. Open sores. Look for changes in your nipples, such as: Fluid coming out of a nipple. Fluid around a nipple. Bleeding. Dimpling. Redness. A nipple that looks pushed in or that has changed position. Feel for changes Lie on your back. Feel each breast. To do this: Pick a breast to feel. Place a pillow under the shoulder closest to that breast. Put the arm closest to that breast behind your head. Feel the breast using the hand of your other arm. Use the pads of your three middle fingers to make small circles starting near the nipple. Use light, medium, and firm pressure. Keep making circles, moving down over the breast. Stop when you feel your ribs. Start making circles with your fingers again, this time going up until you reach your collarbone. Then, make circles out across your breast and into your armpit area. Squeeze your nipple. Check for fluid and lumps. Do these steps again  to check your other breast. Sit or stand in the tub or shower. With soapy water on your skin, feel each breast the same way you did when you were lying down. Write down what you find Writing down what you find can help you keep track of what you want to tell your doctor. Write down: What's normal for each breast. Any changes you find. Write down: The kind of change. If your breast feels tender or painful. Any lump you find. Write down its size and where it is. When you last had your period. General tips If you're breastfeeding, the best time  to check your breasts is after you feed your baby or after you use a breast pump. If you get a period, the best time to check your breasts is 5-7 days after your period ends. With time, you'll get more used to doing the self-exam. You'll also start to know if there are changes in your breasts. Contact a doctor if: You see a change in the shape or size of your breasts or nipples. You see a change in the skin of your breast or nipples. You have fluid coming from your nipples that isn't normal. You find a new lump or thick area. You have breast pain. You have any concerns about your breast health. This information is not intended to replace advice given to you by your health care provider. Make sure you discuss any questions you have with your health care provider. Document Revised: 02/01/2024 Document Reviewed: 02/01/2024 Elsevier Patient Education  2025 [employer]. Pap Test: What to Know Why am I having this test? A Pap test, also called a Pap smear, is a screening test to check for signs of: Infection. Cancer of the cervix. The cervix is the lowest part of the uterus. Precancerous changes. These are changes that may be a sign that cancer is developing. Females need this test regularly. In general, you should have a Pap test every 3 years until you reach menopause or you are 44 years old. If you are 47-72 years old you may choose to have their Pap test done at the same time as an human papillomavirus (HPV) test every 5 years instead of every 3 years. Your health care provider may recommend having Pap tests more or less often depending on your medical conditions and past Pap test results. What is being tested? Cervical cells are tested for signs of infection or abnormalities. What kind of sample is taken?  Your provider will collect a sample of cells from the surface of your cervix. This will be done using a small cotton swab, plastic spatula, or brush that is inserted into your vagina  using a tool called a speculum. This sample is often collected during a pelvic exam, when you are lying on your back on an exam table with your feet in footrests, called stirrups. In some cases, fluids (secretions) from the cervix or vagina may also be collected. How do I prepare for this test? Know where you are in your menstrual cycle. If you're menstruating on the day of the test, you may be asked to reschedule. You may need to reschedule if you have a known vaginal infection on the day of the test. Follow instructions from your provider about: Changing or stopping your regular medicines. Some medicines, such as vaginal medicines and tetracycline, can cause abnormal test results. Avoiding douching 2-3 days before or the day of the test. Tell a health care provider about: Any allergies you have.  All medicines you take. These include vitamins, herbs, eye drops, and creams. Any bleeding problems you have. Any surgeries you've had. Any medical problems you have. Whether you're pregnant or may be pregnant. How are the results reported? Your test results will be reported as either abnormal or normal. What do the results mean? A normal test result means that you do not have signs of cancer of the cervix. An abnormal result may mean that you have: Cancer. A Pap test by itself is not enough to diagnose cancer. You will have more tests done if cancer is suspected. Precancerous changes in your cervix. Inflammation of the cervix. A sexually transmitted infection (STI). A fungal infection. An infection from a parasite. Talk with your provider about what your results mean. More tests may be needed. Questions to ask your health care provider Ask your provider, or the department that is doing the test: When will my results be ready? How will I get my results? What are my treatment options? What other tests do I need? What are my next steps? This information is not intended to replace advice  given to you by your health care provider. Make sure you discuss any questions you have with your health care provider. Document Revised: 02/11/2024 Document Reviewed: 02/11/2024 Elsevier Patient Education  2025 [employer].
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
## 2024-11-20 ENCOUNTER — Encounter: Payer: Self-pay | Admitting: General Surgery

## 2024-11-20 ENCOUNTER — Ambulatory Visit: Admitting: General Surgery

## 2024-11-20 ENCOUNTER — Encounter

## 2024-11-20 VITALS — BP 148/90 | HR 93 | Ht 67.0 in | Wt 200.0 lb

## 2024-11-20 DIAGNOSIS — K641 Second degree hemorrhoids: Secondary | ICD-10-CM | POA: Diagnosis not present

## 2024-11-20 DIAGNOSIS — K644 Residual hemorrhoidal skin tags: Secondary | ICD-10-CM | POA: Diagnosis not present

## 2024-11-20 DIAGNOSIS — K640 First degree hemorrhoids: Secondary | ICD-10-CM | POA: Diagnosis not present

## 2024-11-20 LAB — CBC WITH DIFFERENTIAL/PLATELET
Basophils Absolute: 0 x10E3/uL (ref 0.0–0.2)
Basos: 1 %
EOS (ABSOLUTE): 0.1 x10E3/uL (ref 0.0–0.4)
Eos: 1 %
Hematocrit: 42 % (ref 34.0–46.6)
Hemoglobin: 13.6 g/dL (ref 11.1–15.9)
Immature Grans (Abs): 0 x10E3/uL (ref 0.0–0.1)
Immature Granulocytes: 0 %
Lymphocytes Absolute: 2.3 x10E3/uL (ref 0.7–3.1)
Lymphs: 36 %
MCH: 30.5 pg (ref 26.6–33.0)
MCHC: 32.4 g/dL (ref 31.5–35.7)
MCV: 94 fL (ref 79–97)
Monocytes Absolute: 0.3 x10E3/uL (ref 0.1–0.9)
Monocytes: 4 %
Neutrophils Absolute: 3.7 x10E3/uL (ref 1.4–7.0)
Neutrophils: 58 %
Platelets: 274 x10E3/uL (ref 150–450)
RBC: 4.46 x10E6/uL (ref 3.77–5.28)
RDW: 12 % (ref 11.7–15.4)
WBC: 6.3 x10E3/uL (ref 3.4–10.8)

## 2024-11-20 LAB — LIPID PANEL WITH LDL/HDL RATIO
Cholesterol, Total: 188 mg/dL (ref 100–199)
HDL: 47 mg/dL (ref >39)
LDL Chol Calc (NIH): 114 mg/dL — ABNORMAL HIGH (ref 0–99)
LDL/HDL Ratio: 2.4 ratio (ref 0.0–3.2)
Triglycerides: 152 mg/dL — ABNORMAL HIGH (ref 0–149)
VLDL Cholesterol Cal: 27 mg/dL (ref 5–40)

## 2024-11-20 LAB — HEPATITIS C ANTIBODY: Hep C Virus Ab: NONREACTIVE

## 2024-11-20 LAB — TSH: TSH: 0.81 u[IU]/mL (ref 0.450–4.500)

## 2024-11-20 LAB — VITAMIN D 25 HYDROXY (VIT D DEFICIENCY, FRACTURES): Vit D, 25-Hydroxy: 41.3 ng/mL (ref 30.0–100.0)

## 2024-11-20 LAB — SYPHILIS: RPR WITH REFLEX TO RPR TITER: RPR Ser Ql: NONREACTIVE

## 2024-11-20 LAB — COMPREHENSIVE METABOLIC PANEL WITH GFR
ALT: 11 IU/L (ref 0–32)
AST: 16 IU/L (ref 0–40)
Albumin: 4 g/dL (ref 3.9–4.9)
Alkaline Phosphatase: 56 IU/L (ref 41–116)
BUN/Creatinine Ratio: 9 (ref 9–23)
BUN: 7 mg/dL (ref 6–24)
Bilirubin Total: 0.2 mg/dL (ref 0.0–1.2)
CO2: 19 mmol/L — ABNORMAL LOW (ref 20–29)
Calcium: 8.8 mg/dL (ref 8.7–10.2)
Chloride: 104 mmol/L (ref 96–106)
Creatinine, Ser: 0.8 mg/dL (ref 0.57–1.00)
Globulin, Total: 2.3 g/dL (ref 1.5–4.5)
Glucose: 85 mg/dL (ref 70–99)
Potassium: 4.2 mmol/L (ref 3.5–5.2)
Sodium: 139 mmol/L (ref 134–144)
Total Protein: 6.3 g/dL (ref 6.0–8.5)
eGFR: 93 mL/min/1.73 (ref >59)

## 2024-11-20 LAB — HGB A1C W/O EAG: Hgb A1c MFr Bld: 5.3 % (ref 4.8–5.6)

## 2024-11-20 LAB — HEPATITIS B SURFACE ANTIBODY,QUALITATIVE: Hep B Surface Ab, Qual: NONREACTIVE

## 2024-11-20 LAB — HIV ANTIBODY (ROUTINE TESTING W REFLEX): HIV Screen 4th Generation wRfx: NONREACTIVE

## 2024-11-20 NOTE — Patient Instructions (Addendum)
 Hemorrhoids Hemorrhoids are swollen veins that may form: In the butt (rectum). These are called internal hemorrhoids. Around the opening of the butt (anus). These are called external hemorrhoids. Most hemorrhoids do not cause very bad problems. They often get better with changes to your lifestyle and what you eat. What are the causes? Having trouble pooping (constipation) or watery poop (diarrhea). Pushing too hard when you poop. Pregnancy. Being very overweight (obese). Sitting for too long. Riding a bike for a long time. Heavy lifting or other things that take a lot of effort. Anal sex. What are the signs or symptoms? Pain. Itching or soreness in the butt. Bleeding from the butt. Leaking poop. Swelling. One or more lumps around the opening of your butt. How is this treated? In most cases, hemorrhoids can be treated at home. You may be told to: Change what you eat. Make changes to your lifestyle. If these treatments do not help, you may need to have a procedure done. Your doctor may need to: Place rubber bands at the bottom of the hemorrhoids to make them fall off. Put medicine into the hemorrhoids to shrink them. Shine a type of light on the hemorrhoids to cause them to fall off. Do surgery to get rid of the hemorrhoids. Follow these instructions at home: Medicines Take over-the-counter and prescription medicines only as told by your doctor. Use creams with medicine in them or medicines that you put in your butt as told by your doctor. Eating and drinking  Eat foods that have a lot of fiber in them. These include whole grains, beans, nuts, fruits, and vegetables. Ask your doctor about taking products that have fiber added to them (fibersupplements). Take in less fat. You can do this by: Eating low-fat dairy products. Eating less red meat. Staying away from processed foods. Drink enough fluid to keep your pee (urine) pale yellow. Managing pain and swelling  Take a  warm-water bath (sitz bath) for 20 minutes to ease pain. Do this 3-4 times a day. You may do this in a bathtub. You may also use a portable sitz bath that fits over the toilet. If told, put ice on the painful area. It may help to use ice between your warm baths. Put ice in a plastic bag. Place a towel between your skin and the bag. Leave the ice on for 20 minutes, 2-3 times a day. If your skin turns bright red, take off the ice right away to prevent skin damage. The risk of damage is higher if you cannot feel pain, heat, or cold. General instructions Exercise. Ask your doctor how much and what kind of exercise is best for you. Go to the bathroom when you need to poop. Do not wait. Try not to push too hard when you poop. Keep your butt dry and clean. Use wet toilet paper or moist towelettes after you poop. Do not sit on the toilet for a long time. Contact a doctor if: You have pain and swelling that do not get better with treatment. You have trouble pooping. You cannot poop. You have pain or swelling outside the area of the hemorrhoids. Get help right away if: You have bleeding from the butt that will not stop. This information is not intended to replace advice given to you by your health care provider. Make sure you discuss any questions you have with your health care provider. Document Revised: 08/04/2022 Document Reviewed: 08/04/2022 Elsevier Patient Education  2024 [employer].

## 2024-11-20 NOTE — Progress Notes (Signed)
 Patient ID: Katherine Johnston, female   DOB: Oct 29, 1981, 43 y.o.   MRN: [id number] CC: External Hemorrhoids History of Present Illness Katherine Johnston is a 43 y.o. female with past medical history as below who presents in consultation for hemorrhoids.  The patient reports that she has been struggling with hemorrhoids for 19 years.  She says that she did have an episode of thrombosed hemorrhoids with bleeding in October.  She says that she had pain and bleeding for about 2 days but then it subsided.  She denies any previous history of thrombosed hemorrhoids or any bleeding since.  She does say that she feels some itchiness sometimes and uses over-the-counter products for this.  She does have a history of constipation but is now using fiber supplementation daily as well as MiraLAX as needed.  Now, she reports that her stools are soft and she denies having to strain with toilet.  She has intermittently needed to use sitz bath's for comfort.  She denies any family history of colon or rectal cancer.  She has never been screened for colorectal cancer.  She had 1 vaginal delivery and had a small tear that was repaired at the bedside..  Past Medical History Past Medical History:  Diagnosis Date   ADD (attention deficit disorder) 2008   Dx via [facility]   Allergy    Anxiety    Closed fracture of metatarsal bone 03/26/2022   Dupuytren's contracture of foot 06/14/2008       Past Surgical History:  Procedure Laterality Date   JOINT REPLACEMENT     MANDIBLE SURGERY  2023   Surgery following complication from wisdom tooth extraction   WISDOM TOOTH EXTRACTION Bilateral 1997    Allergies[1]  Current Outpatient Medications  Medication Sig Dispense Refill   norgestimate -ethinyl estradiol  (ESTARYLLA) 0.25-35 MG-MCG tablet Take 1 tablet by mouth daily. Take continuously, skipping placebo pills 90 tablet 4   phentermine (ADIPEX-P) 37.5 MG tablet Take 37.5 mg by mouth daily.     No current  facility-administered medications for this visit.    Family History Family History  Problem Relation Age of Onset   Heart disease Mother    Kidney disease Mother    Endometrial cancer Mother 49   Diabetes Father 59 - 3   Coronary artery disease Father    Heart disease Father    Hyperlipidemia Father    Hypertension Father    Diabetes Brother    ADD / ADHD Brother    Sleep apnea Brother    Heart disease Paternal Grandfather        Social History Social History[2]      ROS Full ROS of systems performed and is otherwise negative there than what is stated in the HPI  Physical Exam Blood pressure (!) 148/90, pulse 93, height 5' 7 (1.702 m), weight 200 lb (90.7 kg), SpO2 99%.  Alert and oriented x 3, no work of breathing room air, regular rate and rhythm abdomen soft, nontender nondistended, rectal exam performed in the presence of a chaperone.  On visual inspection there is external hemorrhoids without any evidence of thrombosis, digital rectal exam without any gross masses or lesions and no gross blood.  On anoscopy exam she has small internal hemorrhoids worse in the right anterior position. Data Reviewed Last A1c was 5.3.  I have personally reviewed the patient's imaging and medical records.    Assessment    Patient with grade 1-2 internal hemorrhoids and small external hemorrhoidal columns.  I discussed  with her that she has very minimal amount of bleeding when there is thrombosis and this is only happened once in the last 19 years.  It sounds like she has gotten her constipation much better under control.  At this time I think we can continue medical management of her hemorrhoids.  If they persist or get worse she will return to clinic for evaluation.  A total of 45 minutes was spent reviewing the patient's chart, performing history and physical and discussing treatment options with the patient.  This time was irrespective of the time spent on anoscopy   Jayson MALVA Endow 11/20/2024, 2:54 PM     [1]  Allergies Allergen Reactions   Penicillins Hives and Dermatitis   Penicillin V Hives  [2]  Social History Tobacco Use   Smoking status: Former    Current packs/day: 0.00    Types: Cigarettes    Quit date: 2006    Years since quitting: 19.9   Smokeless tobacco: Never  Vaping Use   Vaping status: Some Days   Devices: CBD  Substance Use Topics   Alcohol use: Yes    Comment: socially   Drug use: Not Currently

## 2024-11-21 ENCOUNTER — Encounter: Payer: Self-pay | Admitting: Advanced Practice Midwife

## 2024-11-21 LAB — CERVICOVAGINAL ANCILLARY ONLY
Bacterial Vaginitis (gardnerella): NEGATIVE
Candida Glabrata: NEGATIVE
Candida Vaginitis: NEGATIVE
Chlamydia: NEGATIVE
Comment: NEGATIVE
Comment: NEGATIVE
Comment: NEGATIVE
Comment: NEGATIVE
Comment: NEGATIVE
Comment: NORMAL
Neisseria Gonorrhea: NEGATIVE
Trichomonas: NEGATIVE

## 2024-11-22 LAB — CYTOLOGY - PAP
Comment: NEGATIVE
Diagnosis: NEGATIVE
High risk HPV: NEGATIVE

## 2024-11-25 ENCOUNTER — Ambulatory Visit: Payer: Self-pay | Admitting: Advanced Practice Midwife

## 2024-12-05 ENCOUNTER — Ambulatory Visit: Admitting: Family Medicine

## 2025-08-21 ENCOUNTER — Encounter: Admitting: Family Medicine
